# Patient Record
Sex: Female | Born: 1955 | Race: White | Hispanic: No | Marital: Married | Smoking: Never smoker
Health system: Southern US, Community
[De-identification: ages and names within clinical notes are randomized; demographics above are authoritative.]

## PROBLEM LIST (undated history)

## (undated) DIAGNOSIS — R42 Dizziness and giddiness: Secondary | ICD-10-CM

## (undated) DIAGNOSIS — R112 Nausea with vomiting, unspecified: Secondary | ICD-10-CM

## (undated) DIAGNOSIS — Z9889 Other specified postprocedural states: Secondary | ICD-10-CM

## (undated) DIAGNOSIS — K219 Gastro-esophageal reflux disease without esophagitis: Secondary | ICD-10-CM

## (undated) DIAGNOSIS — Z923 Personal history of irradiation: Secondary | ICD-10-CM

## (undated) DIAGNOSIS — M1990 Unspecified osteoarthritis, unspecified site: Secondary | ICD-10-CM

## (undated) DIAGNOSIS — C50919 Malignant neoplasm of unspecified site of unspecified female breast: Secondary | ICD-10-CM

## (undated) DIAGNOSIS — T4145XA Adverse effect of unspecified anesthetic, initial encounter: Secondary | ICD-10-CM

## (undated) DIAGNOSIS — T8859XA Other complications of anesthesia, initial encounter: Secondary | ICD-10-CM

## (undated) HISTORY — DX: Malignant neoplasm of unspecified site of unspecified female breast: C50.919

---

## 2007-01-10 ENCOUNTER — Ambulatory Visit: Payer: Self-pay | Admitting: Family Medicine

## 2007-03-30 HISTORY — PX: COLONOSCOPY: SHX174

## 2007-04-14 ENCOUNTER — Ambulatory Visit: Payer: Self-pay | Admitting: Gastroenterology

## 2011-07-29 DIAGNOSIS — Z8719 Personal history of other diseases of the digestive system: Secondary | ICD-10-CM | POA: Insufficient documentation

## 2011-10-12 ENCOUNTER — Ambulatory Visit: Payer: Self-pay | Admitting: Sports Medicine

## 2012-11-02 ENCOUNTER — Ambulatory Visit: Payer: Self-pay | Admitting: Physical Medicine and Rehabilitation

## 2013-02-07 DIAGNOSIS — M5416 Radiculopathy, lumbar region: Secondary | ICD-10-CM | POA: Insufficient documentation

## 2013-02-07 DIAGNOSIS — G8929 Other chronic pain: Secondary | ICD-10-CM | POA: Insufficient documentation

## 2013-02-07 DIAGNOSIS — M549 Dorsalgia, unspecified: Secondary | ICD-10-CM | POA: Insufficient documentation

## 2013-04-16 ENCOUNTER — Emergency Department: Payer: Self-pay | Admitting: Emergency Medicine

## 2013-04-27 ENCOUNTER — Emergency Department: Payer: Self-pay | Admitting: Emergency Medicine

## 2013-04-27 LAB — COMPREHENSIVE METABOLIC PANEL
ALK PHOS: 77 U/L
ANION GAP: 4 — AB (ref 7–16)
Albumin: 3.6 g/dL (ref 3.4–5.0)
BUN: 14 mg/dL (ref 7–18)
Bilirubin,Total: 0.2 mg/dL (ref 0.2–1.0)
CO2: 31 mmol/L (ref 21–32)
Calcium, Total: 8.5 mg/dL (ref 8.5–10.1)
Chloride: 108 mmol/L — ABNORMAL HIGH (ref 98–107)
Creatinine: 0.99 mg/dL (ref 0.60–1.30)
EGFR (Non-African Amer.): >60
GLUCOSE: 102 mg/dL — AB (ref 65–99)
OSMOLALITY: 286 (ref 275–301)
Potassium: 3.5 mmol/L (ref 3.5–5.1)
SGOT(AST): 17 U/L (ref 15–37)
SGPT (ALT): 17 U/L (ref 12–78)
SODIUM: 143 mmol/L (ref 136–145)
Total Protein: 7 g/dL (ref 6.4–8.2)

## 2013-04-27 LAB — CBC
HCT: 36.3 % (ref 35.0–47.0)
HGB: 12.1 g/dL (ref 12.0–16.0)
MCH: 29.6 pg (ref 26.0–34.0)
MCHC: 33.2 g/dL (ref 32.0–36.0)
MCV: 89 fL (ref 80–100)
PLATELETS: 225 10*3/uL (ref 150–440)
RBC: 4.08 10*6/uL (ref 3.80–5.20)
RDW: 12.9 % (ref 11.5–14.5)
WBC: 6.7 10*3/uL (ref 3.6–11.0)

## 2013-04-27 LAB — LIPASE, BLOOD: Lipase: 102 U/L (ref 73–393)

## 2014-02-07 DIAGNOSIS — M5030 Other cervical disc degeneration, unspecified cervical region: Secondary | ICD-10-CM | POA: Insufficient documentation

## 2014-02-07 DIAGNOSIS — M791 Myalgia, unspecified site: Secondary | ICD-10-CM | POA: Insufficient documentation

## 2014-02-13 DIAGNOSIS — R1013 Epigastric pain: Secondary | ICD-10-CM | POA: Insufficient documentation

## 2014-04-22 ENCOUNTER — Ambulatory Visit: Payer: Self-pay | Admitting: Gastroenterology

## 2014-06-03 ENCOUNTER — Ambulatory Visit: Payer: Self-pay | Admitting: Gastroenterology

## 2014-06-10 ENCOUNTER — Ambulatory Visit: Payer: Self-pay | Admitting: Gastroenterology

## 2014-06-20 ENCOUNTER — Ambulatory Visit: Payer: Self-pay | Admitting: Gastroenterology

## 2014-07-22 LAB — SURGICAL PATHOLOGY

## 2014-08-27 ENCOUNTER — Emergency Department
Admission: EM | Admit: 2014-08-27 | Discharge: 2014-08-27 | Disposition: A | Discharge status: Home / Self Care | Payer: Federal, State, Local not specified - PPO | Attending: Emergency Medicine | Admitting: Emergency Medicine

## 2014-08-27 ENCOUNTER — Encounter: Payer: Self-pay | Admitting: Emergency Medicine

## 2014-08-27 DIAGNOSIS — Y9289 Other specified places as the place of occurrence of the external cause: Secondary | ICD-10-CM | POA: Diagnosis not present

## 2014-08-27 DIAGNOSIS — Y9389 Activity, other specified: Secondary | ICD-10-CM | POA: Diagnosis not present

## 2014-08-27 DIAGNOSIS — Y998 Other external cause status: Secondary | ICD-10-CM | POA: Insufficient documentation

## 2014-08-27 DIAGNOSIS — M5137 Other intervertebral disc degeneration, lumbosacral region: Secondary | ICD-10-CM | POA: Diagnosis not present

## 2014-08-27 DIAGNOSIS — S39012A Strain of muscle, fascia and tendon of lower back, initial encounter: Secondary | ICD-10-CM | POA: Diagnosis not present

## 2014-08-27 DIAGNOSIS — M51379 Other intervertebral disc degeneration, lumbosacral region without mention of lumbar back pain or lower extremity pain: Secondary | ICD-10-CM

## 2014-08-27 DIAGNOSIS — X58XXXA Exposure to other specified factors, initial encounter: Secondary | ICD-10-CM | POA: Insufficient documentation

## 2014-08-27 DIAGNOSIS — M545 Low back pain: Secondary | ICD-10-CM | POA: Diagnosis present

## 2014-08-27 MED ORDER — PREDNISONE 10 MG PO TABS: 10.0000 mg | ORAL_TABLET | Freq: Two times a day (BID) | ORAL | Status: DC

## 2014-08-27 MED ORDER — DIAZEPAM 2 MG PO TABS: 2.0000 mg | ORAL_TABLET | Freq: Three times a day (TID) | ORAL | Status: AC | PRN

## 2014-08-27 MED ORDER — KETOROLAC TROMETHAMINE 60 MG/2ML IM SOLN
INTRAMUSCULAR | Status: AC
  Administered 2014-08-27: 60 mg via INTRAMUSCULAR
  Filled 2014-08-27: qty 2

## 2014-08-27 MED ORDER — DIAZEPAM 5 MG/ML IJ SOLN
5.0000 mg | Freq: Once | INTRAMUSCULAR | Status: AC
  Administered 2014-08-27: 5 mg via INTRAMUSCULAR

## 2014-08-27 MED ORDER — HYDROCODONE-ACETAMINOPHEN 5-325 MG PO TABS: 1.0000 | ORAL_TABLET | Freq: Four times a day (QID) | ORAL | Status: DC | PRN

## 2014-08-27 MED ORDER — DIAZEPAM 5 MG/ML IJ SOLN: 5.0000 mg | Freq: Once | INTRAMUSCULAR | Status: DC

## 2014-08-27 MED ORDER — DIAZEPAM 5 MG/ML IJ SOLN
INTRAMUSCULAR | Status: AC
  Filled 2014-08-27: qty 2

## 2014-08-27 MED ORDER — KETOROLAC TROMETHAMINE 60 MG/2ML IM SOLN
60.0000 mg | Freq: Once | INTRAMUSCULAR | Status: AC
  Administered 2014-08-27: 60 mg via INTRAMUSCULAR

## 2014-08-27 NOTE — Discharge Instructions (Signed)

## 2014-08-27 NOTE — ED Provider Notes (Signed)
[facility] Emergency Department Provider Note ?____________________________________________ ? Time seen: 1520 ? I have reviewed the triage vital signs and the nursing notes. ________ HISTORY ? Chief Complaint Back Pain  HPI  Teresa Silva is a 59 y.o. female ports to the ED with her daughter and son-in-law, with complaints of increasing low back pain over the last few weeks. She denies any injury or trauma, and notes her pain is increased with increasing activity. She describes that yesterday evening her pain worsened substantially, noting bilateral low back pain and referral into the buttocks and down to the knees. The pain as sharp in nature, and pain is increased to spasm when she tries to transition from sitting to standing and, or stand to sit. She denies any incontinence, she denies any lower extremity weakness, she denies any foot drop. She has dosed ibuprofen for her symptoms and denies significant improvement. She recently discontinued use of Lyrica about 6 months ago, noting that her symptoms had improved. She has been followed by Dr. Sharlet Salina, unable to get in with him emergently. She had epidural steroid injections last year with good response. She rates her pain at a 10 out of 10 currently.   History reviewed. No pertinent past medical history.  There are no active problems to display for this patient. ? History reviewed. No pertinent past surgical history. ? Current Outpatient Rx  Name  Route  Sig  Dispense  Refill  . diazepam (VALIUM) 2 MG tablet   Oral   Take 1 tablet (2 mg total) by mouth every 8 (eight) hours as needed for muscle spasms.   10 tablet   0   . HYDROcodone-acetaminophen (NORCO) 5-325 MG per tablet   Oral   Take 1 tablet by mouth every 6 (six) hours as needed for moderate pain.   10 tablet   0   . predniSONE (DELTASONE) 10 MG tablet   Oral   Take 1 tablet (10 mg total) by mouth 2 (two) times daily with a meal.   10 tablet  0   ? Allergies Review of patient's allergies indicates not on file. ? History reviewed. No pertinent family history. ? Social History History  Substance Use Topics  . Smoking status: Never Smoker   . Smokeless tobacco: Not on file  . Alcohol Use: No   Review of Systems  Constitutional: Negative for fever. HEENT: Negative for head trauma, visual changes, sore throat. Cardiovascular: Negative for chest pain. Respiratory: Negative for shortness of breath. Musculoskeletal: Positive for back pain. Skin: Negative for rash. Neurological: Negative for headaches, focal weakness or numbness.  10-point ROS otherwise negative. ____________________________________________  PHYSICAL EXAM:  VITAL SIGNS: ED Triage Vitals  Enc Vitals Group     BP 08/27/14 1347 132/80 mmHg     Pulse Rate 08/27/14 1347 100     Resp 08/27/14 1347 20     Temp 08/27/14 1347 98.3 F (36.8 C)     Temp Source 08/27/14 1347 Oral     SpO2 08/27/14 1347 99 %     Weight 08/27/14 1347 116 lb (52.617 kg)     Height 08/27/14 1347 5\' 3"  (1.6 m)     Head Cir --      Peak Flow --      Pain Score 08/27/14 1347 10     Pain Loc --      Pain Edu? --      Excl. in [place]? --    Constitutional: Alert and oriented. Well appearing  and in no distress. HEENT:Normocephalic and atraumatic.  PERRL. Normal extraocular movements.  No congestion/rhinnorhea. Mucous membranes are moist. Neck: Supple. No cervical lymphadenopathy. Cardiovascular: Normal rate, regular rhythm. No murmurs, rubs, or gallops. Normal and symmetric distal pulses are present in all extremities.  Respiratory: Normal respiratory effort without tachypnea. Breath sounds are clear and equal bilaterally. No wheezes/rales/rhonchi. Gastrointestinal: Soft and nontender. No distention. No abdominal bruits. There is no CVA tenderness. Musculoskeletal: Nontender with normal range of motion in all extremities.  No lower extremity tenderness nor edema. Tenderness to palp  along the lumbar paraspinals and across the sacrum. Negative SLR bilaterally.  Neurologic:  Normal speech and language. CN II-XII grossly intact. No gait instability. Normal DTRs bilaterally. Normal toe dorsiflexion. Skin:  Skin is warm, dry and intact. No rash noted. Psychiatric: Mood and affect are normal. Patient exhibits appropriate insight and judgment. _____________ PROCEDURES ? Procedure(s) performed: Valium 5 mg IM     Toradol 60 mg IM  Critical Care performed:   No ______________________________________________________ INITIAL IMPRESSION / ASSESSMENT AND PLAN / ED COURSE Discussed flare of chronic lumbar DDD with HNP.  Suggest treatment with steroids, muscle relaxant, and pain medicine.  Patient with a stable exam, no indication for imaging at this presentation. She will follow-up with Dr. Sharlet Salina this week.  ____________________________________________ FINAL CLINICAL IMPRESSION(S) / ED DIAGNOSES?  Final diagnoses:  DDD (degenerative disc disease), lumbosacral  Lumbosacral strain, initial encounter      Melvenia Needles, PA-C 08/27/14 1559  Orbie Pyo, MD 08/27/14 [phone]

## 2014-08-27 NOTE — ED Notes (Signed)
Reports mild back pain for 3 years, last night it got worse when she moved.

## 2015-07-30 ENCOUNTER — Other Ambulatory Visit: Payer: Self-pay | Admitting: Preventative Medicine

## 2015-07-30 DIAGNOSIS — Z1231 Encounter for screening mammogram for malignant neoplasm of breast: Secondary | ICD-10-CM

## 2015-08-15 ENCOUNTER — Ambulatory Visit: Payer: Federal, State, Local not specified - PPO

## 2015-08-27 ENCOUNTER — Ambulatory Visit
Admission: RE | Admit: 2015-08-27 | Discharge: 2015-08-27 | Disposition: A | Discharge status: Home / Self Care | Payer: Federal, State, Local not specified - PPO | Source: Ambulatory Visit | Attending: Preventative Medicine | Admitting: Preventative Medicine

## 2015-08-27 DIAGNOSIS — Z1231 Encounter for screening mammogram for malignant neoplasm of breast: Secondary | ICD-10-CM | POA: Insufficient documentation

## 2015-09-03 ENCOUNTER — Other Ambulatory Visit: Payer: Self-pay | Admitting: Preventative Medicine

## 2015-09-03 DIAGNOSIS — R928 Other abnormal and inconclusive findings on diagnostic imaging of breast: Secondary | ICD-10-CM

## 2015-09-10 ENCOUNTER — Other Ambulatory Visit: Payer: Self-pay | Admitting: Preventative Medicine

## 2015-09-10 ENCOUNTER — Ambulatory Visit
Admission: RE | Admit: 2015-09-10 | Discharge: 2015-09-10 | Disposition: A | Discharge status: Home / Self Care | Payer: Federal, State, Local not specified - PPO | Source: Ambulatory Visit | Attending: Preventative Medicine | Admitting: Preventative Medicine

## 2015-09-10 DIAGNOSIS — R928 Other abnormal and inconclusive findings on diagnostic imaging of breast: Secondary | ICD-10-CM | POA: Diagnosis present

## 2015-09-10 DIAGNOSIS — R921 Mammographic calcification found on diagnostic imaging of breast: Secondary | ICD-10-CM | POA: Insufficient documentation

## 2015-09-12 ENCOUNTER — Ambulatory Visit
Admission: RE | Admit: 2015-09-12 | Discharge: 2015-09-12 | Disposition: A | Discharge status: Home / Self Care | Payer: Federal, State, Local not specified - PPO | Source: Ambulatory Visit | Attending: Preventative Medicine | Admitting: Preventative Medicine

## 2015-09-12 DIAGNOSIS — D0511 Intraductal carcinoma in situ of right breast: Secondary | ICD-10-CM | POA: Insufficient documentation

## 2015-09-12 DIAGNOSIS — R921 Mammographic calcification found on diagnostic imaging of breast: Secondary | ICD-10-CM | POA: Diagnosis present

## 2015-09-12 HISTORY — PX: BREAST BIOPSY: SHX20

## 2015-09-16 LAB — SURGICAL PATHOLOGY

## 2015-09-17 ENCOUNTER — Telehealth: Payer: Self-pay

## 2015-09-17 ENCOUNTER — Telehealth: Payer: Self-pay | Admitting: Surgery

## 2015-09-17 NOTE — Telephone Encounter (Signed)
Please call Teresa Silva in the cancer center. Patient is newly diagnosed with breast cancer. They needed an appointment as soon as possible. An appointment was made for Thursday 6/22 at 2pm with Dr Dahlia Byes. Lelon Frohlich is going to ask Dr Grayland Ormond if he thinks an oncology appointment needs to be made, and she would like you to ask the same from Dr Dahlia Byes. Please call and advise.

## 2015-09-17 NOTE — Telephone Encounter (Signed)
Notified patient of Oncology appointment with Dr. Grayland Ormond on Tuesday 09/23/15 at 10:00.

## 2015-09-18 ENCOUNTER — Encounter: Payer: Self-pay | Admitting: Surgery

## 2015-09-18 ENCOUNTER — Ambulatory Visit (INDEPENDENT_AMBULATORY_CARE_PROVIDER_SITE_OTHER): Payer: Federal, State, Local not specified - PPO | Admitting: Surgery

## 2015-09-18 ENCOUNTER — Other Ambulatory Visit: Payer: Self-pay | Admitting: Surgery

## 2015-09-18 VITALS — BP 146/83 | HR 94 | Temp 98.3°F | Ht 63.5 in | Wt 120.0 lb

## 2015-09-18 DIAGNOSIS — D0511 Intraductal carcinoma in situ of right breast: Secondary | ICD-10-CM | POA: Diagnosis not present

## 2015-09-18 NOTE — Patient Instructions (Addendum)
Please go to your appointment with the medical oncologist.  We will send your referral to see the Radiation Oncologist at the [facility].  Please refer to your blue sheet for surgery questions. Surgery will be on 10/13/2015.

## 2015-09-18 NOTE — Telephone Encounter (Signed)
Patient has appointment with 09/23/15 at 10:15am with Dr. Grayland Ormond per Lelon Frohlich. Patient has been notified of this appointment. Breast Cancer Education bag was left in [facility] and will need to be given to patient at appointment.

## 2015-09-18 NOTE — Progress Notes (Signed)
Surgical Consultation  09/18/2015  Teresa Silva is an 60 y.o. female.   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 U/S core bx  ER and PR negative. She denies any pain, any nipple discharge or any masses. She is healing very well from the biopsy site. She has good cardiovascular reserve and is able to perform more than 4 Mets of activity without any shortness of breath or chest pain. He has been 4 years since her last previous normal mammogram GYN History: Menarche  60 years old, 11 years ago. No evidence of exogenous estrogen use  Family Breast Cancer History: cousin with breast cancer no other breast cancer history and no history of ovarian cancer  History reviewed. No pertinent past medical history.  Past Surgical History  Procedure Laterality Date  . Breast biopsy Right 09/12/2015    stereo  . Colonoscopy  2009    no polyps    Family History  Problem Relation Age of Onset  . Hypertension Mother   . Lung cancer Mother   . Arthritis Mother   . Hypertension Father   . COPD Father   . Liver cancer Sister   . Breast cancer Cousin     Social History:  reports that she has never smoked. She does not have any smokeless tobacco history on file. She reports that she does not drink alcohol. Her drug history is not on file.  Allergies:  Allergies  Allergen Reactions  . Tramadol Hcl Other (See Comments)    Flu Like Symtom    Medications reviewed.   Review of Systems:   ROS 10 pt ROS negative  Physical Exam:  BP 146/83 mmHg  Pulse 94  Temp(Src) 98.3 F (36.8 C) (Oral)  Ht 5' 3.5" (1.613 m)  Wt 54.432 kg (120 lb)  BMI 20.92 kg/m2  Physical Exam NAD awake alert NEck: no LAD, NO JVD, trach midline Breast Exam: RIGHT 12 o'clock bx site 5 cms from nipple, no infection, NO masses, nipples and skin is normal. Normal left breast LYmph nodes: no  axillary or neck LAD RESP: lungs CTA CV: s1,s2, no murmurs ABD: soft, NT, no masses, no hernia EXT: well perfused , warm , no edema  NEURO: awake alert, no motor or sensory deficits. CN intact.   Assessment/Plan: 60 year old with newly diagnosed  DCIS on routine mammogram. Discussed in detail with her about breast cancer and multimodal therapy. Options about mastectomy versus lumpectomy plus radiation therapy were discussed at length. I also discussed with her about the role for sentinel lymph node biopsy although technically is a DCIS with a small fossae have also explained to her that there is a chance of the pathology being upgraded and then having to perform a sentinel lymph node biopsy. With that being said the patient prefers to go ahead and have her axillae stage with sentinel lymph node bx. We'll also make arrangements to see medical oncology and radiation oncology. If she chooses to have a lumpectomy she will require radiation therapy. Also discussed with her that chemotherapy would be based on her final pathology and on staging of her axilla. I have also discussed with her about the procedure, risk, benefits and possible complications including but not limited to: Bleeding, infection, reexcision, nerve injury, lymphedema. She understands and wishes to proceed. We will tentatively schedule her for July 17 for a right needle localization lumpectomy with sentinel lymph node biopsy.  All questions were answered and extensive counseling was provided  Jules Husbands MD FACS

## 2015-09-19 ENCOUNTER — Telehealth: Payer: Self-pay | Admitting: Surgery

## 2015-09-19 NOTE — Telephone Encounter (Signed)
Pt advised of pre op date/time and sx date. Sx: 10/13/15 with Dr Othelia Pulling lumpectomy with Sentinel Node and Needle Localization.  Pre op: 10/03/15 between 9-1:00 pm--Phone.   Patient made aware to arrive at [facility] the day of surgery at 8:00 am.

## 2015-09-22 NOTE — Progress Notes (Signed)
  Oncology Nurse Navigator Documentation  Navigator Location: [facility]-Med Onc (09/22/15 0800) Navigator Encounter Type: Introductory phone call;Telephone (09/22/15 0800) Telephone: [name] Call;Appt Confirmation/Clarification;Education (09/22/15 0800) Abnormal Finding Date: 09/10/15 (09/22/15 0800) Confirmed Diagnosis Date: 09/12/15 (09/22/15 0800) Surgery Date: 10/13/15 (09/22/15 0800) Treatment Initiated Date: 10/13/15 (09/22/15 0800) Patient Visit Type: Initial (09/22/15 0800) Treatment Phase: Pre-Tx/Tx Discussion (09/22/15 0800) Barriers/Navigation Needs: Education;Coordination of Care (09/22/15 0800) Education: Accessing Care/ Finding Providers;Coping with Diagnosis/ Prognosis;Newly Diagnosed Cancer Education (09/22/15 0800) Interventions: Coordination of Care;Education Method (09/22/15 0800)            Acuity: Level 2 (09/22/15 0800)   Acuity Level 2: Initial guidance, education and coordination as needed;Educational needs;Assistance expediting appointments (09/22/15 0800)     Time Spent with Patient: 60 (09/22/15 0800)   Introduced IT trainer. Scheduled appointments for Surgery Consult with Dr. Dahlia Byes, and Breast Clinic with Dr. Grayland Ormond.  Took Breast Cancer Treatment Handbook to surgeon's office.  Will meet patient in Breast Clinic on 09/23/15.   Collected history/risk factors for case conference.

## 2015-09-23 ENCOUNTER — Inpatient Hospital Stay: Payer: Federal, State, Local not specified - PPO | Attending: Oncology | Admitting: Oncology

## 2015-09-23 ENCOUNTER — Encounter: Payer: Self-pay | Admitting: Oncology

## 2015-09-23 VITALS — BP 145/82 | HR 88 | Temp 98.6°F | Resp 18 | Wt 120.8 lb

## 2015-09-23 DIAGNOSIS — D0511 Intraductal carcinoma in situ of right breast: Secondary | ICD-10-CM | POA: Insufficient documentation

## 2015-09-23 DIAGNOSIS — Z171 Estrogen receptor negative status [ER-]: Secondary | ICD-10-CM | POA: Diagnosis not present

## 2015-09-23 NOTE — Progress Notes (Signed)
[place]  [phone] [phone]  ID: Teresa Silva OB: 1955/08/15  MR#: [id number]  [id number]  Patient Care Team: Pcp Not In System as PCP - General  CHIEF COMPLAINT: ER/PR negative right breast DCIS. Chief Complaint  Patient presents with  . Breast Cancer    INTERVAL HISTORY: Patient is a 60 year old female who was noted to have an abnormality on routine screening mammogram. Subsequent biopsy revealed DCIS that was ER/PR negative. She currently is anxious, but otherwise feels well. She has no neurologic complaints. She denies any recent fevers. She has a good appetite and denies weight loss. She denies any pain. She has no chest pain or shortness of breath. She denies any nausea, vomiting, constipation, or diarrhea. She has no urinary complaints. Patient feels at her baseline and offers no further specific complaints today.  REVIEW OF SYSTEMS:   Review of Systems  Constitutional: Negative.  Negative for fever, weight loss and malaise/fatigue.  Respiratory: Negative.  Negative for sputum production.   Cardiovascular: Negative.  Negative for chest pain.  Gastrointestinal: Negative.  Negative for abdominal pain.  Genitourinary: Negative.   Musculoskeletal: Negative.   Neurological: Negative.  Negative for weakness.  Psychiatric/Behavioral: The patient is nervous/anxious.     As per HPI. Otherwise, a complete review of systems is negatve.  PAST MEDICAL HISTORY: Past Medical History  Diagnosis Date  . Breast cancer ([place])   . Last menstrual period (LMP) > 10 days ago     LMP 2006    PAST SURGICAL HISTORY: Past Surgical History  Procedure Laterality Date  . Breast biopsy Right 09/12/2015    stereo  . Colonoscopy  2009    no polyps    FAMILY HISTORY Family History  Problem Relation Age of Onset  . Hypertension Mother   . Lung cancer Mother   . Arthritis Mother   . Hypertension Father   . COPD Father   . Liver cancer  Sister   . Breast cancer Cousin        ADVANCED DIRECTIVES:    HEALTH MAINTENANCE: Social History  Substance Use Topics  . Smoking status: Never Smoker   . Smokeless tobacco: Not on file  . Alcohol Use: No     Colonoscopy:  PAP:  Bone density:  Lipid panel:  Allergies  Allergen Reactions  . Tramadol Hcl Other (See Comments)    Flu Like Symtom    Current Outpatient Prescriptions  Medication Sig Dispense Refill  . acetaminophen (TYLENOL) 325 MG tablet Take 650 mg by mouth every 6 (six) hours as needed for mild pain.      No current facility-administered medications for this visit.    OBJECTIVE: Filed Vitals:   09/23/15 1028  BP: 145/82  Pulse: 88  Temp: 98.6 F (37 C)  Resp: 18     Body mass index is 21.06 kg/(m^2).    ECOG FS:0 - Asymptomatic  General: Well-developed, well-nourished, no acute distress. Eyes: Pink conjunctiva, anicteric sclera. HEENT: Normocephalic, moist mucous membranes, clear oropharnyx. Breasts: Patient requested exam be deferred today. Lungs: Clear to auscultation bilaterally. Heart: Regular rate and rhythm. No rubs, murmurs, or gallops. Abdomen: Soft, nontender, nondistended. No organomegaly noted, normoactive bowel sounds. Musculoskeletal: No edema, cyanosis, or clubbing. Neuro: Alert, answering all questions appropriately. Cranial nerves grossly intact. Skin: No rashes or petechiae noted. Psych: Normal affect. Lymphatics: No cervical, calvicular, axillary or inguinal LAD.   LAB RESULTS:  Lab Results  Component Value Date   NA 143 04/27/2013  K 3.5 04/27/2013   CL 108* 04/27/2013   CO2 31 04/27/2013   GLUCOSE 102* 04/27/2013   BUN 14 04/27/2013   CREATININE 0.99 04/27/2013   CALCIUM 8.5 04/27/2013   PROT 7.0 04/27/2013   ALBUMIN 3.6 04/27/2013   AST 17 04/27/2013   ALT 17 04/27/2013   ALKPHOS 77 04/27/2013   BILITOT 0.2 04/27/2013   GFRNONAA >60 04/27/2013   GFRAA >60 04/27/2013    Lab Results  Component Value  Date   WBC 6.7 04/27/2013   HGB 12.1 04/27/2013   HCT 36.3 04/27/2013   MCV 89 04/27/2013   PLT 225 04/27/2013     STUDIES: Mm Digital Screening Bilateral  09/01/2015  CLINICAL DATA:  Screening. EXAM: DIGITAL SCREENING BILATERAL MAMMOGRAM WITH CAD COMPARISON:  Previous exam(s). ACR Breast Density Category b: There are scattered areas of fibroglandular density. FINDINGS: In the right breast, microcalcifications/possible mass warrant further evaluation with magnified views. In the left breast, no findings suspicious for malignancy. Images were processed with CAD. IMPRESSION: Further evaluation is suggested for microcalcifications/possible mass in the right breast. RECOMMENDATION: Diagnostic mammogram of the right breast. (Code:[id number]) The patient will be contacted regarding the findings, and additional imaging will be scheduled. BI-RADS CATEGORY  0: Incomplete. Need additional imaging evaluation and/or prior mammograms for comparison. Electronically Signed   By: Marin Olp M.D.   On: 09/01/2015 08:30   Mm Diag Breast Tomo Uni Right  09/10/2015  CLINICAL DATA:  Patient returns today to evaluate calcifications and a possible mass within the upper right breast. EXAM: 2D DIGITAL DIAGNOSTIC UNILATERAL RIGHT MAMMOGRAM WITH CAD AND ADJUNCT TOMO COMPARISON:  Previous exam(s). ACR Breast Density Category b: There are scattered areas of fibroglandular density. FINDINGS: On today's additional views of the right breast with magnification, grouped punctate and slightly pleomorphic calcifications are confirmed within the upper right breast, at middle depth, 12 o'clock axis region, spanning approximately 5 mm. No associated mass is seen with the addition of 3D tomosynthesis. Mammographic images were processed with CAD. IMPRESSION: Grouped punctate and slightly pleomorphic calcifications within the upper right breast, at middle depth, 12 o'clock axis region, for which stereotactic-guided biopsy is recommended.  RECOMMENDATION: Stereotactic-guided biopsy of the right breast calcifications. Findings will be relayed to the ordering physician and patient will be scheduled for stereotactic-guided biopsy at her earliest convenience. I have discussed the findings and recommendations with the patient. Results were also provided in writing at the conclusion of the visit. If applicable, a reminder letter will be sent to the patient regarding the next appointment. BI-RADS CATEGORY  4: Suspicious. Electronically Signed   By: Franki Cabot M.D.   On: 09/10/2015 09:56   Mm Clip Placement Right  09/16/2015  ADDENDUM REPORT: 09/16/2015 15:08 ADDENDUM: The pathology associated with the right breast stereotactic biopsy demonstrated atypical ductal hyperplasia and low-grade DCIS with calcifications. Pathology is concordant with the imaging findings. I have discussed findings with the patient and answered her questions. Patient states that her biopsy site is clean and dry without hematoma formation. There is mild tenderness present. The patient's referring physician's office will contact the patient for surgical referral. Post biopsy wound care instructions were reviewed with the patient and the patient was encouraged to call our breast center for additional questions or concerns. Electronically Signed   By: Altamese Cabal M.D.   On: 09/16/2015 15:08  09/16/2015  CLINICAL DATA:  Status post stereotactic biopsy of right breast calcifications. EXAM: DIAGNOSTIC RIGHT MAMMOGRAM POST STEREOTACTIC BIOPSY COMPARISON:  Previous exam(s). FINDINGS:  Mammographic images were obtained following stereotactic guided biopsy of calcifications in the 12 o'clock region the right breast. Mammographic images showed there is a top hat shaped clip in the 12 o'clock region of the right breast. IMPRESSION: Status post stereotactic biopsy of the right breast with pathology pending. Final Assessment: Post Procedure Mammograms for Marker Placement Electronically  Signed: By: Lillia Mountain M.D. On: 09/12/2015 12:08   Mm Rt Breast Bx W Loc Dev 1st Lesion Image Bx Spec Stereo Guide  09/12/2015  CLINICAL DATA:  Right breast calcifications. EXAM: RIGHT BREAST STEREOTACTIC CORE NEEDLE BIOPSY COMPARISON:  Previous exams. FINDINGS: The patient and I discussed the procedure of stereotactic-guided biopsy including benefits and alternatives. We discussed the high likelihood of a successful procedure. We discussed the risks of the procedure including infection, bleeding, tissue injury, clip migration, and inadequate sampling. Informed written consent was given. The usual time out protocol was performed immediately prior to the procedure. Using sterile technique and 1% lidocaine 1% lidocaine with epinephrine as local anesthetic, under stereotactic guidance, a 9 gauge vacuum assisted device was used to perform core needle biopsy of calcifications in the 12 o'clock region the right breast using a superior to inferior approach. Specimen radiograph was performed showing calcifications are present in the tissue samples. Specimens with calcifications are identified for pathology. At the conclusion of the procedure, a top hat shaped tissue marker clip was deployed into the biopsy cavity. Follow-up 2-view mammogram was performed and dictated separately. IMPRESSION: Stereotactic-guided biopsy of the right breast. No apparent complications. Electronically Signed   By: Lillia Mountain M.D.   On: 09/12/2015 11:54    ASSESSMENT: Right breast DCIS, ER/PR negative.  PLAN:    1. Right breast DCIS: Pathology from biopsy did not have an invasive component and was also ER/PR negative which is unusual for DCIS. Agree with pursuing lumpectomy and patient has surgery scheduled for October 13, 2015.  Although lesion is ER/PR negative, because there is not invasive component she likely will not require adjuvant chemotherapy. If final pathology results with an invasive component will further discuss adjuvant  treatment at her next clinic visit. Patient will definitely benefit from adjuvant XRT and will have consultation with radiation oncology at the end of July. Tamoxifen will not benefit the patient given the ER/PR status of her DCIS. No further intervention is needed at this time. Return to clinic at the end of July after her surgery to discuss her final pathology results and additional treatment planning.  Approximately 45 minutes was spent in discussion of which greater than 50% was consultation.  Patient expressed understanding and was in agreement with this plan. She also understands that She can call clinic at any time with any questions, concerns, or complaints.     Lloyd Huger, MD   09/23/2015 12:40 PM

## 2015-09-23 NOTE — Progress Notes (Signed)
  Oncology Nurse Navigator Documentation  Navigator Location: [facility]Med Onc (09/23/15 1500) Navigator Encounter Type: Initial MedOnc (09/23/15 1500)           Patient Visit Type: MedOnc;Initial (09/23/15 1500) Treatment Phase: Pre-Tx/Tx Discussion (09/23/15 1500) Barriers/Navigation Needs: Education;Coordination of Care (09/23/15 1500)                Acuity: Level 2 (09/23/15 1500)   Acuity Level 2: Initial guidance, education and coordination as needed;Assistance expediting appointments (09/23/15 1500)     Time Spent with Patient: 60 (09/23/15 1500)   Met with patient, and husband at initial Med Onc visit.  The joy of their lives is their two year old grandson.  Both are retired.  Husband has back issues and is being followed at [facility].  Dr. Grayland Ormond reviewed treatment plan of surgery followed by radiation, dependent on final pathology. She is scheduledto return on 10/21/15 for consult with Dr. Baruch Gouty, and to follow-up with Dr. Grayland Ormond.

## 2015-09-23 NOTE — Progress Notes (Signed)
States is feeling well. Offers no complaints. Surgery scheduled for 7/17 with Dr. Dahlia Byes.

## 2015-09-27 HISTORY — PX: BREAST LUMPECTOMY: SHX2

## 2015-10-01 ENCOUNTER — Other Ambulatory Visit: Payer: Self-pay | Admitting: *Deleted

## 2015-10-01 ENCOUNTER — Telehealth: Payer: Self-pay | Admitting: *Deleted

## 2015-10-01 MED ORDER — HYDROCODONE-ACETAMINOPHEN 5-325 MG PO TABS: 1.0000 | ORAL_TABLET | Freq: Four times a day (QID) | ORAL | Status: DC | PRN

## 2015-10-01 NOTE — Telephone Encounter (Signed)
Patient called in to report pain at site of breast biopsy. Patient advised by triage RN that pain at biopsy site is expected.

## 2015-10-01 NOTE — Telephone Encounter (Signed)
Follow up call placed to patient regarding breast pain at biopsy site. Patient denies redness, warmth or drainage from site. Patient reassured that pain is normal, per Dr. Grayland Ormond. Patient offered prescription for Norco for short term pain management. Patient to pick up prescription today. Patient verbalized understanding of plan.

## 2015-10-03 ENCOUNTER — Other Ambulatory Visit: Payer: Federal, State, Local not specified - PPO

## 2015-10-03 ENCOUNTER — Encounter: Payer: Self-pay | Admitting: *Deleted

## 2015-10-03 NOTE — Patient Instructions (Signed)
  Your procedure is scheduled on: 10-13-15 Report to [place] @ 8 AM PER PT    Remember: Instructions that are not followed completely may result in serious medical risk, up to and including death, or upon the discretion of your surgeon and anesthesiologist your surgery may need to be rescheduled.    _x___ 1. Do not eat food or drink liquids after midnight. No gum chewing or hard candies.     __x__ 2. No Alcohol for 24 hours before or after surgery.   __x__3. No Smoking for 24 prior to surgery.   ____  4. Bring all medications with you on the day of surgery if instructed.    __x__ 5. Notify your doctor if there is any change in your medical condition     (cold, fever, infections).     Do not wear jewelry, make-up, hairpins, clips or nail polish.  Do not wear lotions, powders, or perfumes. You may wear deodorant.  Do not shave 48 hours prior to surgery. Men may shave face and neck.  Do not bring valuables to the hospital.    [facility] is not responsible for any belongings or valuables.               Contacts, dentures or bridgework may not be worn into surgery.  Leave your suitcase in the car. After surgery it may be brought to your room.  For patients admitted to the hospital, discharge time is determined by your treatment team.   Patients discharged the day of surgery will not be allowed to drive home.    Please read over the following fact sheets that you were given:   [facility] Preparing for Surgery and or MRSA Information   _x___ Take these medicines the morning of surgery with A SIP OF WATER:    1. ZANTAC  2. TAKE A ZANTAC ON Sunday NIGHT BEFORE BED  3.  4.  5.  6.  ____ Fleet Enema (as directed)   ____ Use CHG Soap or sage wipes as directed on instruction sheet   ____ Use inhalers on the day of surgery and bring to hospital day of surgery  ____ Stop metformin 2 days prior to surgery    ____ Take 1/2 of usual insulin dose the night before surgery  and none on the morning of surgery.   ____ Stop aspirin or coumadin, or plavix  _x__ Stop Anti-inflammatories such as Advil, Aleve, Ibuprofen, Motrin, Naproxen,          Naprosyn, Goodies powders or aspirin products. Ok to take Tylenol.   ____ Stop supplements until after surgery.    ____ Bring C-Pap to the hospital.

## 2015-10-13 ENCOUNTER — Ambulatory Visit
Admission: RE | Admit: 2015-10-13 | Discharge: 2015-10-13 | Disposition: A | Discharge status: Home / Self Care | Payer: Federal, State, Local not specified - PPO | Source: Ambulatory Visit | Attending: Surgery | Admitting: Surgery

## 2015-10-13 ENCOUNTER — Encounter
Admission: RE | Disposition: A | Discharge status: Home / Self Care | Payer: Self-pay | Source: Ambulatory Visit | Attending: Surgery

## 2015-10-13 ENCOUNTER — Ambulatory Visit: Payer: Federal, State, Local not specified - PPO | Admitting: Anesthesiology

## 2015-10-13 ENCOUNTER — Encounter: Payer: Self-pay | Admitting: *Deleted

## 2015-10-13 ENCOUNTER — Encounter
Admission: RE | Admit: 2015-10-13 | Discharge: 2015-10-13 | Disposition: A | Discharge status: Home / Self Care | Payer: Federal, State, Local not specified - PPO | Source: Ambulatory Visit | Attending: Surgery | Admitting: Surgery

## 2015-10-13 DIAGNOSIS — Z800 Family history of malignant neoplasm of digestive organs: Secondary | ICD-10-CM | POA: Diagnosis not present

## 2015-10-13 DIAGNOSIS — Z825 Family history of asthma and other chronic lower respiratory diseases: Secondary | ICD-10-CM | POA: Diagnosis not present

## 2015-10-13 DIAGNOSIS — Z803 Family history of malignant neoplasm of breast: Secondary | ICD-10-CM | POA: Diagnosis not present

## 2015-10-13 DIAGNOSIS — Z888 Allergy status to other drugs, medicaments and biological substances status: Secondary | ICD-10-CM | POA: Insufficient documentation

## 2015-10-13 DIAGNOSIS — K219 Gastro-esophageal reflux disease without esophagitis: Secondary | ICD-10-CM | POA: Diagnosis not present

## 2015-10-13 DIAGNOSIS — Z171 Estrogen receptor negative status [ER-]: Secondary | ICD-10-CM | POA: Insufficient documentation

## 2015-10-13 DIAGNOSIS — D0511 Intraductal carcinoma in situ of right breast: Secondary | ICD-10-CM

## 2015-10-13 DIAGNOSIS — Z801 Family history of malignant neoplasm of trachea, bronchus and lung: Secondary | ICD-10-CM | POA: Insufficient documentation

## 2015-10-13 DIAGNOSIS — C50911 Malignant neoplasm of unspecified site of right female breast: Secondary | ICD-10-CM | POA: Diagnosis present

## 2015-10-13 DIAGNOSIS — Z8249 Family history of ischemic heart disease and other diseases of the circulatory system: Secondary | ICD-10-CM | POA: Insufficient documentation

## 2015-10-13 DIAGNOSIS — Z8261 Family history of arthritis: Secondary | ICD-10-CM | POA: Diagnosis not present

## 2015-10-13 HISTORY — PX: BREAST LUMPECTOMY WITH NEEDLE LOCALIZATION AND AXILLARY LYMPH NODE DISSECTION: SHX5758

## 2015-10-13 HISTORY — DX: Gastro-esophageal reflux disease without esophagitis: K21.9

## 2015-10-13 HISTORY — DX: Unspecified osteoarthritis, unspecified site: M19.90

## 2015-10-13 HISTORY — PX: BREAST EXCISIONAL BIOPSY: SUR124

## 2015-10-13 HISTORY — PX: SENTINEL NODE BIOPSY: SHX6608

## 2015-10-13 SURGERY — BREAST LUMPECTOMY WITH NEEDLE LOCALIZATION AND AXILLARY LYMPH NODE DISSECTION
Anesthesia: General | Laterality: Right

## 2015-10-13 MED ORDER — FENTANYL CITRATE (PF) 100 MCG/2ML IJ SOLN
25.0000 ug | INTRAMUSCULAR | Status: DC | PRN
  Administered 2015-10-13 (×3): 25 ug via INTRAVENOUS

## 2015-10-13 MED ORDER — TECHNETIUM TC 99M SULFUR COLLOID
0.9990 | Freq: Once | INTRAVENOUS | Status: AC | PRN
  Administered 2015-10-13: 0.999 via INTRAVENOUS

## 2015-10-13 MED ORDER — FENTANYL CITRATE (PF) 100 MCG/2ML IJ SOLN
INTRAMUSCULAR | Status: DC
Start: 2015-10-13 — End: 2015-10-13
  Filled 2015-10-13: qty 2

## 2015-10-13 MED ORDER — LACTATED RINGERS IV SOLN
INTRAVENOUS | Status: DC
  Administered 2015-10-13: 10:00:00 via INTRAVENOUS

## 2015-10-13 MED ORDER — KETOROLAC TROMETHAMINE 30 MG/ML IJ SOLN
INTRAMUSCULAR | Status: DC | PRN
  Administered 2015-10-13: 30 mg via INTRAVENOUS

## 2015-10-13 MED ORDER — ACETAMINOPHEN 10 MG/ML IV SOLN
INTRAVENOUS | Status: AC
  Filled 2015-10-13: qty 100

## 2015-10-13 MED ORDER — OXYCODONE HCL 5 MG PO TABS: 5.0000 mg | ORAL_TABLET | Freq: Once | ORAL | Status: DC | PRN

## 2015-10-13 MED ORDER — CEFAZOLIN SODIUM-DEXTROSE 2-4 GM/100ML-% IV SOLN
2.0000 g | INTRAVENOUS | Status: AC
  Administered 2015-10-13: 2 g via INTRAVENOUS

## 2015-10-13 MED ORDER — DEXAMETHASONE SODIUM PHOSPHATE 10 MG/ML IJ SOLN
INTRAMUSCULAR | Status: DC | PRN
  Administered 2015-10-13: 4 mg via INTRAVENOUS

## 2015-10-13 MED ORDER — LIDOCAINE HCL (CARDIAC) 20 MG/ML IV SOLN
INTRAVENOUS | Status: DC | PRN
  Administered 2015-10-13: 80 mg via INTRAVENOUS

## 2015-10-13 MED ORDER — METHYLENE BLUE 0.5 % INJ SOLN: INTRAVENOUS | Status: DC | PRN

## 2015-10-13 MED ORDER — CHLORHEXIDINE GLUCONATE CLOTH 2 % EX PADS: 6.0000 | MEDICATED_PAD | Freq: Once | CUTANEOUS | Status: DC

## 2015-10-13 MED ORDER — MEPERIDINE HCL 25 MG/ML IJ SOLN: 6.2500 mg | INTRAMUSCULAR | Status: DC | PRN

## 2015-10-13 MED ORDER — ISOSULFAN BLUE 1 % ~~LOC~~ SOLN
SUBCUTANEOUS | Status: AC
  Filled 2015-10-13: qty 5

## 2015-10-13 MED ORDER — PHENYLEPHRINE HCL 10 MG/ML IJ SOLN
INTRAMUSCULAR | Status: DC | PRN
  Administered 2015-10-13 (×4): 50 ug via INTRAVENOUS

## 2015-10-13 MED ORDER — ISOSULFAN BLUE 1 % ~~LOC~~ SOLN
SUBCUTANEOUS | Status: DC | PRN
  Administered 2015-10-13: 5 mL via SUBCUTANEOUS

## 2015-10-13 MED ORDER — PROMETHAZINE HCL 25 MG/ML IJ SOLN: 6.2500 mg | INTRAMUSCULAR | Status: DC | PRN

## 2015-10-13 MED ORDER — LIDOCAINE-EPINEPHRINE 1 %-1:100000 IJ SOLN
INTRAMUSCULAR | Status: AC
  Filled 2015-10-13: qty 1

## 2015-10-13 MED ORDER — FENTANYL CITRATE (PF) 100 MCG/2ML IJ SOLN
INTRAMUSCULAR | Status: DC | PRN
  Administered 2015-10-13 (×3): 25 ug via INTRAVENOUS
  Administered 2015-10-13: 50 ug via INTRAVENOUS

## 2015-10-13 MED ORDER — CEFAZOLIN SODIUM-DEXTROSE 2-4 GM/100ML-% IV SOLN
INTRAVENOUS | Status: AC
  Filled 2015-10-13: qty 100

## 2015-10-13 MED ORDER — PROPOFOL 10 MG/ML IV BOLUS
INTRAVENOUS | Status: DC | PRN
  Administered 2015-10-13: 110 mg via INTRAVENOUS

## 2015-10-13 MED ORDER — OXYCODONE-ACETAMINOPHEN 5-325 MG PO TABS: 1.0000 | ORAL_TABLET | ORAL | Status: DC | PRN

## 2015-10-13 MED ORDER — BUPIVACAINE-EPINEPHRINE (PF) 0.25% -1:200000 IJ SOLN
INTRAMUSCULAR | Status: AC
  Filled 2015-10-13: qty 30

## 2015-10-13 MED ORDER — MIDAZOLAM HCL 2 MG/2ML IJ SOLN
INTRAMUSCULAR | Status: DC | PRN
  Administered 2015-10-13: 2 mg via INTRAVENOUS

## 2015-10-13 MED ORDER — ONDANSETRON HCL 4 MG/2ML IJ SOLN
INTRAMUSCULAR | Status: DC | PRN
  Administered 2015-10-13: 4 mg via INTRAVENOUS

## 2015-10-13 MED ORDER — OXYCODONE HCL 5 MG/5ML PO SOLN: 5.0000 mg | Freq: Once | ORAL | Status: DC | PRN

## 2015-10-13 MED ORDER — BUPIVACAINE-EPINEPHRINE 0.25% -1:200000 IJ SOLN
INTRAMUSCULAR | Status: DC | PRN
  Administered 2015-10-13: 30 mL

## 2015-10-13 MED ORDER — ACETAMINOPHEN 10 MG/ML IV SOLN
INTRAVENOUS | Status: DC | PRN
  Administered 2015-10-13: 1000 mg via INTRAVENOUS

## 2015-10-13 SURGICAL SUPPLY — 24 items
APPLIER CLIP 9.375 SM OPEN (CLIP) ×3
CANISTER SUCT 1200ML W/VALVE (MISCELLANEOUS) ×3 IMPLANT
CHLORAPREP W/TINT 26ML (MISCELLANEOUS) ×3 IMPLANT
CLIP APPLIE 9.375 SM OPEN (CLIP) ×1 IMPLANT
DRAPE LAPAROTOMY 100X77 ABD (DRAPES) ×3 IMPLANT
ELECT REM PT RETURN 9FT ADLT (ELECTROSURGICAL) ×3
ELECTRODE REM PT RTRN 9FT ADLT (ELECTROSURGICAL) ×1 IMPLANT
GLOVE BIO SURGEON STRL SZ7 (GLOVE) ×3 IMPLANT
GLOVE INDICATOR 7.5 STRL GRN (GLOVE) ×3 IMPLANT
GOWN STRL REUS W/ TWL LRG LVL3 (GOWN DISPOSABLE) ×2 IMPLANT
GOWN STRL REUS W/TWL LRG LVL3 (GOWN DISPOSABLE) ×4
KIT RM TURNOVER STRD PROC AR (KITS) ×3 IMPLANT
LIQUID BAND (GAUZE/BANDAGES/DRESSINGS) ×3 IMPLANT
NEEDLE HYPO 22GX1.5 SAFETY (NEEDLE) ×3 IMPLANT
PACK BASIN MINOR ARMC (MISCELLANEOUS) ×3 IMPLANT
SPONGE LAP 18X18 5 PK (GAUZE/BANDAGES/DRESSINGS) ×3 IMPLANT
SUT MNCRL AB 4-0 PS2 18 (SUTURE) ×3 IMPLANT
SUT SILK 0 SH 30 (SUTURE) ×3 IMPLANT
SUT VIC AB 0 CT2 27 (SUTURE) ×3 IMPLANT
SUT VIC AB 1 CTX 27 (SUTURE) ×3 IMPLANT
SUT VIC AB 3-0 SH 27 (SUTURE) ×4
SUT VIC AB 3-0 SH 27X BRD (SUTURE) ×2 IMPLANT
SYRINGE 10CC LL (SYRINGE) ×3 IMPLANT
WATER STERILE IRR 1000ML POUR (IV SOLUTION) ×3 IMPLANT

## 2015-10-13 NOTE — Anesthesia Preprocedure Evaluation (Signed)
Anesthesia Evaluation  Patient identified by MRN, date of birth, ID band Patient awake    Reviewed: Allergy & Precautions, NPO status , Patient's Chart, lab work & pertinent test results  History of Anesthesia Complications Negative for: history of anesthetic complications  Airway Mallampati: I  TM Distance: >3 FB Neck ROM: Full    Dental no notable dental hx.    Pulmonary neg COPD,    Pulmonary exam normal - rhonchi (-) wheezing      Cardiovascular Exercise Tolerance: Good (-) hypertension(-) CAD and (-) Past MI  Rhythm:Regular Rate:Normal - Systolic murmurs and - Diastolic murmurs    Neuro/Psych negative neurological ROS     GI/Hepatic Neg liver ROS, GERD  Controlled,  Endo/Other  neg diabetes  Renal/GU negative Renal ROS     Musculoskeletal  (+) Arthritis , Osteoarthritis,    Abdominal Normal abdominal exam  (+)   Peds  Hematology negative hematology ROS (+)   Anesthesia Other Findings Breast cancer  Reproductive/Obstetrics                             Anesthesia Physical Anesthesia Plan  ASA: III  Anesthesia Plan: General   Post-op Pain Management:    Induction: Intravenous  Airway Management Planned: LMA  Additional Equipment:   Intra-op Plan:   Post-operative Plan: Extubation in OR  Informed Consent: I have reviewed the patients History and Physical, chart, labs and discussed the procedure including the risks, benefits and alternatives for the proposed anesthesia with the patient or authorized representative who has indicated his/her understanding and acceptance.   Dental advisory given  Plan Discussed with:   Anesthesia Plan Comments:         Anesthesia Quick Evaluation

## 2015-10-13 NOTE — Anesthesia Procedure Notes (Signed)
Procedure Name: LMA Insertion Performed by: Danen Lapaglia Pre-anesthesia Checklist: Patient identified, Patient being monitored, Timeout performed, Emergency Drugs available and Suction available Patient Re-evaluated:Patient Re-evaluated prior to inductionOxygen Delivery Method: Circle system utilized Preoxygenation: Pre-oxygenation with 100% oxygen Intubation Type: IV induction Ventilation: Mask ventilation without difficulty LMA: LMA inserted LMA Size: 3.0 Tube type: Oral Number of attempts: 1 Placement Confirmation: positive ETCO2 and breath sounds checked- equal and bilateral Tube secured with: Tape Dental Injury: Teeth and Oropharynx as per pre-operative assessment        

## 2015-10-13 NOTE — Transfer of Care (Signed)
Immediate Anesthesia Transfer of Care Note  Patient: Teresa Silva  Procedure(s) Performed: Procedure(s): BREAST LUMPECTOMY WITH NEEDLE LOCALIZATION AND AXILLARY LYMPH NODE DISSECTION (Right) SENTINEL NODE BIOPSY (Right)  Patient Location: PACU  Anesthesia Type:General  Level of Consciousness: awake, alert  and responds to stimulation  Airway & Oxygen Therapy: Patient Spontanous Breathing and Patient connected to face mask oxygen  Post-op Assessment: Report given to RN and Post -op Vital signs reviewed and stable  Post vital signs: Reviewed and stable  Last Vitals:  Filed Vitals:   10/13/15 1204 10/13/15 1206  BP: 123/71 123/71  Pulse: 85 84  Temp: 36.4 C   Resp: 18 11    Last Pain:  Filed Vitals:   10/13/15 1207  PainSc: 3          Complications: No apparent anesthesia complications

## 2015-10-13 NOTE — Discharge Instructions (Addendum)
Lumpectomy, Care After  Refer to this sheet in the next few weeks. These instructions provide you with information on caring for yourself after your procedure. Your health care provider may also give you more specific instructions. Your treatment has been planned according to current medical practices, but problems sometimes occur. Call your health care provider if you have any problems or questions after your procedure.  WHAT TO EXPECT AFTER THE PROCEDURE  After your procedure, it is typical to have soreness, bruising, and swelling of your breast. This is normal. You will be given medicines to control your pain.  HOME CARE INSTRUCTIONS  Take medicines only as directed by your health care provider.  Resume a normal diet as directed by your health care provider.  Resume normal activity as directed by your health care provider. Avoid strenuous activity that affects the arm on the side that the surgical cut (incision) was made. Avoid playing tennis, swimming, lifting heavy objects (those that weigh more than 10 pounds [4.5 kg]), and pulling for 2 weeks.  Change bandages (dressings) as directed by your health care provider.  Consider wearing a bra to bed if you feel discomfort at the breast. Wearing a bra also helps keep dressings on.  Keep all follow-up visits as directed by your health care provider. This is important.  Call for the results of your procedure as instructed by your surgeon. It is your responsibility to get your test results. Do not assume everything is fine if you have not heard from your health care provider.  Keep the incision site dry.  If the incision site is tender, applying an ice pack may relieve some discomfort. To do this:  Put ice in a plastic bag.  Place a towel between your skin and the bag.  Leave the ice on for 15-20 minutes, 3-4 times a day.   Sentinel Lymph Node Biopsy in Breast Cancer Treatment, Care After   Refer to this sheet in the next few weeks. These  instructions provide you with information on caring for yourself after your procedure. Your health care provider may also give you more specific instructions. Your treatment has been planned according to current medical practices, but problems sometimes occur. Call your health care provider if you have any problems or questions after your procedure.   WHAT TO EXPECT AFTER THE PROCEDURE  After your procedure, it is typical to have the following:  Your urine may be blue for the next 24 hours. This is normal. It is caused by the dye used during the procedure.  Your skin at the injection site may be blue for up to 8 weeks.  You may feel numbness, tingling, or pain near your surgical cut (incision).  You may have swelling or bruising near your incision. HOME CARE INSTRUCTIONS  Avoid vigorous exercise. Ask your health care provider when you can return to your normal activities.  You may shower 24 hours after your procedure. It is okay to get your incision wet. Gently pat the incision dry after you shower.  If you are given a surgical bra, wear it for the next 48 hours. You may remove the bra to shower.  Do not remove skin adhesive strips over your incision. They will fall off on their own over time.  Take medicines only as directed by your health care provider.  You may resume your regular diet.  Do not have your blood pressure taken on the side you had the biopsy until your health care provider says it  is okay.  Keep all follow-up visits as directed by your health care provider.     SEEK MEDICAL CARE IF:  You have increased bleeding from the incision site.  You notice redness, swelling, or increasing pain in the incision.  You have pus coming from the incision site.  You have a fever.  You notice a foul smell coming from the incision or dressing. SEEK IMMEDIATE MEDICAL CARE IF:  You develop a rash.  You have shortness of breath.  You have chest pain. This information is not intended to  replace advice given to you by your health care provider. Make sure you discuss any questions you have with your health care provider.    General Anesthesia, Adult, Care After Refer to this sheet in the next few weeks. These instructions provide you with information on caring for yourself after your procedure. Your health care provider may also give you more specific instructions. Your treatment has been planned according to current medical practices, but problems sometimes occur. Call your health care provider if you have any problems or questions after your procedure. WHAT TO EXPECT AFTER THE PROCEDURE After the procedure, it is typical to experience:  Sleepiness.  Nausea and vomiting. HOME CARE INSTRUCTIONS  For the first 24 hours after general anesthesia:  Have a responsible person with you.  Do not drive a car. If you are alone, do not take public transportation.  Do not drink alcohol.  Do not take medicine that has not been prescribed by your health care provider.  Do not sign important papers or make important decisions.  You may resume a normal diet and activities as directed by your health care provider.  Change bandages (dressings) as directed.  If you have questions or problems that seem related to general anesthesia, call the hospital and ask for the anesthetist or anesthesiologist on call. SEEK MEDICAL CARE IF:  You have nausea and vomiting that continue the day after anesthesia.  You develop a rash. SEEK IMMEDIATE MEDICAL CARE IF:   You have difficulty breathing.  You have chest pain.  You have any allergic problems.   This information is not intended to replace advice given to you by your health care provider. Make sure you discuss any questions you have with your health care provider.   Document Released: 06/21/2000 Document Revised: 04/05/2014 Document Reviewed: 07/14/2011 Elsevier Interactive Patient Education [employer].

## 2015-10-13 NOTE — Anesthesia Postprocedure Evaluation (Signed)
Anesthesia Post Note  Patient: [name]  Procedure(s) Performed: Procedure(s) (LRB): BREAST LUMPECTOMY WITH NEEDLE LOCALIZATION AND AXILLARY LYMPH NODE DISSECTION (Right) SENTINEL NODE BIOPSY (Right)  Patient location during evaluation: PACU Anesthesia Type: General Level of consciousness: awake and alert and oriented Pain management: pain level controlled Vital Signs Assessment: vitals unstable and post-procedure vital signs reviewed and stable Respiratory status: spontaneous breathing, nonlabored ventilation, respiratory function stable and patient connected to nasal cannula oxygen Cardiovascular status: blood pressure returned to baseline and stable Postop Assessment: no signs of nausea or vomiting Anesthetic complications: no    Last Vitals:  Filed Vitals:   10/13/15 1219 10/13/15 1230  BP: 112/62   Pulse: 81 77  Temp:    Resp: 11 10    Last Pain:  Filed Vitals:   10/13/15 1238  PainSc: 4                  Teresa Silva

## 2015-10-13 NOTE — Op Note (Signed)
  Pre-operative Diagnosis:  DCIS right Breast     Post-operative Diagnosis: Same  Surgeon: Caroleen Hamman, MD FACS  Anesthesia: General, LMA  Procedure: Needle localization lumpectomy, sentinel node biopsy  Findings: xray of the specimen revealed clip and wire within the excised tissue.  Estimated Blood Loss: Minimal         Drains: None         Specimens: partial mastectomy with labels, long lateral and short superior; sentinel node   Procedure Details  The patient was seen again in the Holding Room. The benefits, complications, treatment options, and expected outcomes were discussed with the patient. The risks of bleeding, infection, recurrence of symptoms, failure to resolve symptoms, hematoma, seroma, open wound, cosmetic deformity, and the need for further surgery were discussed.  The patient was taken to Operating Room, identified as [name] and the procedure verified.  A Time Out was held and the above information confirmed.  Prior to the induction of general anesthesia, antibiotic prophylaxis was administered. VTE prophylaxis was in place. Appropriate anesthesia was then administered and tolerated well. The chest was prepped with Chloraprep and draped in the sterile fashion. The patient was positioned in the supine position.   A visual dye was injected periareolar early under aseptic conditions. Then using the hand-held probe an area of high counts was identified in the axilla, an incision was made and direction by the probe aided in dissection of a lymph node , there were three sentinel nodes that were both hot and blue that were sent for permanent pathology.  Attention was turned to the needle localization site where an incision was made inferior to the needle, sub q flaps were developed and the wire was delivered via the incision. Dissection around the needle to perform a partial mastectomy with adequate margins was performed. This was done with electrocautery . Hemostasis  was with electrocautery. Additional Marcaine was infiltrated into the skin and subcutaneous tissues of the cavity. Once assuring that hemostasis was adequate and checked multiple times the wound was closed with interrupted 3-0 Vicryl followed by 4-0 subcuticular Monocryl sutures.  The axillary wound was closed in a similar fashion.  I feel from the specimen was sent and review. The both the clips some microcalcifications and the wire water containing within the excised tissue. There was apparently a close margin anterior to the clip but this was where I performed the skin flaps so there was no further anterior margin to resect.   Dermabond was used to coat the skin incisions.   Caroleen Hamman, MD, FACS

## 2015-10-13 NOTE — Interval H&P Note (Signed)
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 After consideration of risks, benefits and other options for treatment, the patient has consented to  Procedure(s): BREAST LUMPECTOMY WITH NEEDLE LOCALIZATION AND AXILLARY LYMPH NODE DISSECTION (Right) SENTINEL NODE BIOPSY (Right) as a surgical intervention .  The patient's history has been reviewed, patient examined, no change in status, stable for surgery.  I have reviewed the patient's chart and labs.  Questions were answered to the patient's satisfaction.     [place]

## 2015-10-13 NOTE — H&P (View-Only) (Signed)
Surgical Consultation  09/18/2015  Teresa Silva is an 60 y.o. female.   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 U/S core bx  ER and PR negative. She denies any pain, any nipple discharge or any masses. She is healing very well from the biopsy site. She has good cardiovascular reserve and is able to perform more than 4 Mets of activity without any shortness of breath or chest pain. He has been 4 years since her last previous normal mammogram GYN History: Menarche  60 years old, 11 years ago. No evidence of exogenous estrogen use  Family Breast Cancer History: cousin with breast cancer no other breast cancer history and no history of ovarian cancer  History reviewed. No pertinent past medical history.  Past Surgical History  Procedure Laterality Date  . Breast biopsy Right 09/12/2015    stereo  . Colonoscopy  2009    no polyps    Family History  Problem Relation Age of Onset  . Hypertension Mother   . Lung cancer Mother   . Arthritis Mother   . Hypertension Father   . COPD Father   . Liver cancer Sister   . Breast cancer Cousin     Social History:  reports that she has never smoked. She does not have any smokeless tobacco history on file. She reports that she does not drink alcohol. Her drug history is not on file.  Allergies:  Allergies  Allergen Reactions  . Tramadol Hcl Other (See Comments)    Flu Like Symtom    Medications reviewed.   Review of Systems:   ROS 10 pt ROS negative  Physical Exam:  BP 146/83 mmHg  Pulse 94  Temp(Src) 98.3 F (36.8 C) (Oral)  Ht 5' 3.5" (1.613 m)  Wt 54.432 kg (120 lb)  BMI 20.92 kg/m2  Physical Exam NAD awake alert NEck: no LAD, NO JVD, trach midline Breast Exam: RIGHT 12 o'clock bx site 5 cms from nipple, no infection, NO masses, nipples and skin is normal. Normal left breast LYmph nodes: no  axillary or neck LAD RESP: lungs CTA CV: s1,s2, no murmurs ABD: soft, NT, no masses, no hernia EXT: well perfused , warm , no edema  NEURO: awake alert, no motor or sensory deficits. CN intact.   Assessment/Plan: 60 year old with newly diagnosed  DCIS on routine mammogram. Discussed in detail with her about breast cancer and multimodal therapy. Options about mastectomy versus lumpectomy plus radiation therapy were discussed at length. I also discussed with her about the role for sentinel lymph node biopsy although technically is a DCIS with a small fossae have also explained to her that there is a chance of the pathology being upgraded and then having to perform a sentinel lymph node biopsy. With that being said the patient prefers to go ahead and have her axillae stage with sentinel lymph node bx. We'll also make arrangements to see medical oncology and radiation oncology. If she chooses to have a lumpectomy she will require radiation therapy. Also discussed with her that chemotherapy would be based on her final pathology and on staging of her axilla. I have also discussed with her about the procedure, risk, benefits and possible complications including but not limited to: Bleeding, infection, reexcision, nerve injury, lymphedema. She understands and wishes to proceed. We will tentatively schedule her for July 17 for a right needle localization lumpectomy with sentinel lymph node biopsy.  All questions were answered and extensive counseling was provided  Jules Husbands MD FACS

## 2015-10-14 ENCOUNTER — Telehealth: Payer: Self-pay | Admitting: Surgery

## 2015-10-14 DIAGNOSIS — C50919 Malignant neoplasm of unspecified site of unspecified female breast: Secondary | ICD-10-CM

## 2015-10-14 HISTORY — DX: Malignant neoplasm of unspecified site of unspecified female breast: C50.919

## 2015-10-14 LAB — SURGICAL PATHOLOGY

## 2015-10-14 NOTE — Telephone Encounter (Signed)
D/w pt about path results

## 2015-10-21 ENCOUNTER — Encounter: Payer: Self-pay | Admitting: Radiation Oncology

## 2015-10-21 ENCOUNTER — Inpatient Hospital Stay: Payer: Federal, State, Local not specified - PPO | Attending: Oncology | Admitting: Oncology

## 2015-10-21 ENCOUNTER — Ambulatory Visit
Admission: RE | Admit: 2015-10-21 | Discharge: 2015-10-21 | Disposition: A | Discharge status: Home / Self Care | Payer: Federal, State, Local not specified - PPO | Source: Ambulatory Visit | Attending: Radiation Oncology | Admitting: Radiation Oncology

## 2015-10-21 VITALS — BP 128/80 | HR 91 | Temp 98.2°F | Resp 18 | Ht 63.0 in | Wt 118.6 lb

## 2015-10-21 DIAGNOSIS — Z801 Family history of malignant neoplasm of trachea, bronchus and lung: Secondary | ICD-10-CM | POA: Diagnosis not present

## 2015-10-21 DIAGNOSIS — M129 Arthropathy, unspecified: Secondary | ICD-10-CM | POA: Insufficient documentation

## 2015-10-21 DIAGNOSIS — Z800 Family history of malignant neoplasm of digestive organs: Secondary | ICD-10-CM | POA: Diagnosis not present

## 2015-10-21 DIAGNOSIS — K219 Gastro-esophageal reflux disease without esophagitis: Secondary | ICD-10-CM | POA: Insufficient documentation

## 2015-10-21 DIAGNOSIS — D0511 Intraductal carcinoma in situ of right breast: Secondary | ICD-10-CM | POA: Diagnosis present

## 2015-10-21 DIAGNOSIS — Z803 Family history of malignant neoplasm of breast: Secondary | ICD-10-CM | POA: Insufficient documentation

## 2015-10-21 DIAGNOSIS — Z510 Encounter for antineoplastic radiation therapy: Secondary | ICD-10-CM | POA: Diagnosis not present

## 2015-10-21 NOTE — Progress Notes (Signed)
  Oncology Nurse Navigator Documentation  Navigator Location: [facility] Onc (10/21/15 1400) Navigator Encounter Type: Initial RadOnc (10/21/15 1400)           Patient Visit Type: Initial;RadOnc (10/21/15 1400) Treatment Phase: Pre-Tx/Tx Discussion (10/21/15 1400) Barriers/Navigation Needs: Financial;Transportation;Education (10/21/15 1400) Education: Transport During Treatment;Concerns with Finances/ Eligibility;Concerns with Insurance Coverage (10/21/15 1400) Interventions: Transportations;Education Method;Coordination of Care (10/21/15 1400)   Coordination of Care: Appts (10/21/15 1400) Education Method: Verbal;Teach-back (10/21/15 1400)      Acuity: Level 2 (10/21/15 1400)   Acuity Level 2: Educational needs;Ongoing guidance and education throughout treatment as needed (10/21/15 1400)     Time Spent with Patient: 60 (10/21/15 1400)  Met patient , husband at initial Rad/Onc visit with Dr. Baruch Gouty.  Patient requests assistance with transportation due to distance she  has to travel for treatment.  Given gas voucher through [employer].  Worked with patient to have cancer insurance policy information completed.  Lula Olszewski to assist.

## 2015-10-21 NOTE — Progress Notes (Signed)
Except an outstanding is perfect of Radiation Oncology NEW PATIENT EVALUATION  Name: Teresa Silva  MRN: [id number]  Date:   10/21/2015     DOB: 29-Jun-1955   This 60 y.o. 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  REFERRING PHYSICIAN: Jules Husbands, MD  CHIEF COMPLAINT:  Chief Complaint  Patient presents with  . Breast Cancer    DIAGNOSIS: The encounter diagnosis was DCIS (ductal carcinoma in situ) of breast, right.   PREVIOUS INVESTIGATIONS:  Pathology reports reviewed Mammograms and ultrasound reviewed Clinical notes reviewed  HPI: Patient is a 60 year old female who presented with an abnormal mammogram of the right breast showing pleomorphic calcifications in the upper right breast 12:00 position spanning approxi-5 mm. This was confirmed on ultrasound. Patient underwent ultrasound-guided biopsy which was positive for ductal carcinoma in situ. She went on to have a wide local excision and sentinel node biopsy. Tumor was 1 cm grade 2 ductal carcinoma in situ ER/PR negative. 2 sentinel lymph nodes were negative for metastatic disease. Margins were clear but close at 2 mm. She's been quite sore postoperatively although is healing well. She specifically denies cough or bone pain. She is seen today accompanied by her husband and nurse navigator.  PLANNED TREATMENT REGIMEN: Hypofractionated course of whole breast radiation to her right breast  PAST MEDICAL HISTORY:  has a past medical history of Arthritis; Breast cancer ([place]); and GERD (gastroesophageal reflux disease).    PAST SURGICAL HISTORY:  Past Surgical History:  Procedure Laterality Date  . BREAST BIOPSY Right 09/12/2015   stereo  . BREAST EXCISIONAL BIOPSY Right 10/13/2015   lumpectomy DCIS  . BREAST LUMPECTOMY WITH NEEDLE LOCALIZATION AND AXILLARY LYMPH NODE DISSECTION Right 10/13/2015    Procedure: BREAST LUMPECTOMY WITH NEEDLE LOCALIZATION AND AXILLARY LYMPH NODE DISSECTION;  Surgeon: Jules Husbands, MD;  Location: [facility] ORS;  Service: General;  Laterality: Right;  . COLONOSCOPY  2009   no polyps  . SENTINEL NODE BIOPSY Right 10/13/2015   Procedure: SENTINEL NODE BIOPSY;  Surgeon: Jules Husbands, MD;  Location: [facility] ORS;  Service: General;  Laterality: Right;    FAMILY HISTORY: family history includes Arthritis in her mother; Breast cancer in her cousin; COPD in her father; Hypertension in her father and mother; Liver cancer in her sister; Lung cancer in her mother.  SOCIAL HISTORY:  reports that she has never smoked. She has never used smokeless tobacco. She reports that she drinks alcohol. She reports that she does not use drugs.  ALLERGIES: Tramadol hcl  MEDICATIONS:  Current Outpatient Prescriptions  Medication Sig Dispense Refill  . acetaminophen (TYLENOL) 325 MG tablet Take 650 mg by mouth every 6 (six) hours as needed for mild pain.     Marland Kitchen HYDROcodone-acetaminophen (NORCO/VICODIN) 5-325 MG tablet Take 1 tablet by mouth every 6 (six) hours as needed for moderate pain. 40 tablet 0  . Multiple Vitamin (MULTIVITAMIN) tablet Take 1 tablet by mouth daily.    Marland Kitchen oxyCODONE-acetaminophen (ROXICET) 5-325 MG tablet Take 1-2 tablets by mouth every 4 (four) hours as needed for severe pain. 30 tablet 0  . ranitidine (ZANTAC) 150 MG tablet Take 150 mg by mouth as needed for heartburn.     No current facility-administered medications for this encounter.     ECOG PERFORMANCE STATUS:  0 - Asymptomatic  REVIEW OF SYSTEMS:  Patient denies any weight  loss, fatigue, weakness, fever, chills or night sweats. Patient denies any loss of vision, blurred vision. Patient denies any ringing  of the ears or hearing loss. No irregular heartbeat. Patient denies heart murmur or history of fainting. Patient denies any chest pain or pain radiating to her upper extremities. Patient denies any shortness  of breath, difficulty breathing at night, cough or hemoptysis. Patient denies any swelling in the lower legs. Patient denies any nausea vomiting, vomiting of blood, or coffee ground material in the vomitus. Patient denies any stomach pain. Patient states has had normal bowel movements no significant constipation or diarrhea. Patient denies any dysuria, hematuria or significant nocturia. Patient denies any problems walking, swelling in the joints or loss of balance. Patient denies any skin changes, loss of hair or loss of weight. Patient denies any excessive worrying or anxiety or significant depression. Patient denies any problems with insomnia. Patient denies excessive thirst, polyuria, polydipsia. Patient denies any swollen glands, patient denies easy bruising or easy bleeding. Patient denies any recent infections, allergies or URI. Patient "s visual fields have not changed significantly in recent time.    PHYSICAL EXAM: BP 128/80   Pulse 91   Temp 98.2 F (36.8 C)   Resp 18   Ht 5\' 3"  (1.6 m)   Wt 118 lb 9.7 oz (53.8 kg)   BMI 21.01 kg/m  She is status post wide local excision and sentinel node biopsy of the right breast incision is healing well. Quite tender in the right axilla. No other dominant mass or nodularity is noted in either breast in 2 positions examined. No axillary or supraclavicular adenopathy is appreciated. Well-developed well-nourished patient in NAD. HEENT reveals PERLA, EOMI, discs not visualized.  Oral cavity is clear. No oral mucosal lesions are identified. Neck is clear without evidence of cervical or supraclavicular adenopathy. Lungs are clear to A&P. Cardiac examination is essentially unremarkable with regular rate and rhythm without murmur rub or thrill. Abdomen is benign with no organomegaly or masses noted. Motor sensory and DTR levels are equal and symmetric in the upper and lower extremities. Cranial nerves II through XII are grossly intact. Proprioception is intact. No  peripheral adenopathy or edema is identified. No motor or sensory levels are noted. Crude visual fields are within normal range.  LABORATORY DATA: Pathology reports reviewed    RADIOLOGY RESULTS: Mammograms and ultrasound reviewed   IMPRESSION: Stage 0 ductal carcinoma in situ ER/PR negative status post wide local excision and sentinel node biopsy in 60 year old female  PLAN: At this time I recommended whole breast radiation. Would treat in a hypofractionated course of treatment over 4 weeks. Would also boost her scar based on the close margin to 1600 cGy over 8 fractions using electron beam. Risks and benefits of treatment including skin reaction fatigue inclusion of some superficial lung alteration of blood counts all were discussed in detail with the patient and her husband. Patient will not be candidate for tamoxifen based on negative ER/PR status. I personally set up and ordered CT simulation after she heals another week and a half.  I would like to take this opportunity to thank you for allowing me to participate in the care of your patient.Armstead Peaks., MD

## 2015-10-23 NOTE — Progress Notes (Signed)
This encounter was created in error - please disregard.

## 2015-10-24 ENCOUNTER — Ambulatory Visit: Payer: Federal, State, Local not specified - PPO | Admitting: Surgery

## 2015-10-24 ENCOUNTER — Encounter: Payer: Self-pay | Admitting: Surgery

## 2015-10-24 ENCOUNTER — Ambulatory Visit (INDEPENDENT_AMBULATORY_CARE_PROVIDER_SITE_OTHER): Payer: Federal, State, Local not specified - PPO | Admitting: Surgery

## 2015-10-24 VITALS — BP 105/73 | HR 105 | Temp 98.1°F | Ht 63.0 in | Wt 118.2 lb

## 2015-10-24 DIAGNOSIS — Z09 Encounter for follow-up examination after completed treatment for conditions other than malignant neoplasm: Secondary | ICD-10-CM

## 2015-10-24 NOTE — Progress Notes (Signed)
S/p right lumpectomy and SLNbx for DCIS, final path w negative margins and 2 nodes were negative for mets. ER/PR nega She is doing well and her pain has improved significantly No issues today Path d/w pt in detail She will start radiation soon and has pending Onc appt  PE NAD Incisions healing very well, no infection, no lymphedema or collections  A/p doing very well after lumpectomy No further surgical intervention D/w pt in detail about path and post op expectations She is very appreciative

## 2015-10-24 NOTE — Patient Instructions (Signed)
Please call our office if you have any questions or concerns.  

## 2015-10-31 ENCOUNTER — Institutional Professional Consult (permissible substitution): Payer: Federal, State, Local not specified - PPO | Admitting: Radiation Oncology

## 2015-11-03 ENCOUNTER — Encounter: Payer: Self-pay | Admitting: Emergency Medicine

## 2015-11-03 ENCOUNTER — Emergency Department
Admission: EM | Admit: 2015-11-03 | Discharge: 2015-11-03 | Disposition: A | Discharge status: Home / Self Care | Payer: Federal, State, Local not specified - PPO | Attending: Emergency Medicine | Admitting: Emergency Medicine

## 2015-11-03 DIAGNOSIS — Z791 Long term (current) use of non-steroidal anti-inflammatories (NSAID): Secondary | ICD-10-CM | POA: Diagnosis not present

## 2015-11-03 DIAGNOSIS — Z853 Personal history of malignant neoplasm of breast: Secondary | ICD-10-CM | POA: Diagnosis not present

## 2015-11-03 DIAGNOSIS — R509 Fever, unspecified: Secondary | ICD-10-CM | POA: Diagnosis present

## 2015-11-03 DIAGNOSIS — N390 Urinary tract infection, site not specified: Secondary | ICD-10-CM | POA: Diagnosis not present

## 2015-11-03 LAB — BASIC METABOLIC PANEL
ANION GAP: 8 (ref 5–15)
BUN: 13 mg/dL (ref 6–20)
CHLORIDE: 105 mmol/L (ref 101–111)
CO2: 22 mmol/L (ref 22–32)
Calcium: 8.7 mg/dL — ABNORMAL LOW (ref 8.9–10.3)
Creatinine, Ser: 0.77 mg/dL (ref 0.44–1.00)
Glucose, Bld: 134 mg/dL — ABNORMAL HIGH (ref 65–99)
POTASSIUM: 3.5 mmol/L (ref 3.5–5.1)
SODIUM: 135 mmol/L (ref 135–145)

## 2015-11-03 LAB — URINALYSIS COMPLETE WITH MICROSCOPIC (ARMC ONLY)
Bilirubin Urine: NEGATIVE
GLUCOSE, UA: NEGATIVE mg/dL
NITRITE: NEGATIVE
PROTEIN: 30 mg/dL — AB
Specific Gravity, Urine: 1.021 (ref 1.005–1.030)
pH: 5 (ref 5.0–8.0)

## 2015-11-03 LAB — LACTIC ACID, PLASMA: LACTIC ACID, VENOUS: 0.9 mmol/L (ref 0.5–1.9)

## 2015-11-03 LAB — CBC
HEMATOCRIT: 34.2 % — AB (ref 35.0–47.0)
HEMOGLOBIN: 11.6 g/dL — AB (ref 12.0–16.0)
MCH: 30.2 pg (ref 26.0–34.0)
MCHC: 33.8 g/dL (ref 32.0–36.0)
MCV: 89.3 fL (ref 80.0–100.0)
Platelets: 222 10*3/uL (ref 150–440)
RBC: 3.84 MIL/uL (ref 3.80–5.20)
RDW: 12.6 % (ref 11.5–14.5)
WBC: 14.7 10*3/uL — ABNORMAL HIGH (ref 3.6–11.0)

## 2015-11-03 MED ORDER — CEFTRIAXONE SODIUM 1 G IJ SOLR
1.0000 g | Freq: Once | INTRAMUSCULAR | Status: AC
  Administered 2015-11-03: 1 g via INTRAVENOUS
  Filled 2015-11-03 (×2): qty 10

## 2015-11-03 MED ORDER — CEPHALEXIN 500 MG PO CAPS: 500.0000 mg | ORAL_CAPSULE | Freq: Four times a day (QID) | ORAL | 0 refills | Status: DC

## 2015-11-03 MED ORDER — IBUPROFEN 600 MG PO TABS
600.0000 mg | ORAL_TABLET | ORAL | Status: AC
  Administered 2015-11-03: 600 mg via ORAL
  Filled 2015-11-03: qty 1

## 2015-11-03 MED ORDER — SODIUM CHLORIDE 0.9 % IV BOLUS (SEPSIS)
1000.0000 mL | Freq: Once | INTRAVENOUS | Status: AC
  Administered 2015-11-03: 1000 mL via INTRAVENOUS

## 2015-11-03 NOTE — ED Notes (Signed)
Pt in via triage with complaints of generalized body aches, chills, fever, headache since yesterday morning.  Pt denies any N/V/D.  Pt denies any other symptoms.  Pt tachycardic, w/ low grade fever, other vitals WDL, no immediate distress at this time.  MD at bedside.

## 2015-11-03 NOTE — Discharge Instructions (Signed)

## 2015-11-03 NOTE — ED Triage Notes (Addendum)
Pt to triage via w/c with no distress noted 7/17, lumpectomy; st since yesterday having fever, frontal HA, body aches and nausea; 1 hydrocodone taken 2hrs PTA

## 2015-11-03 NOTE — ED Provider Notes (Signed)
[facility] Emergency Department Provider Note   ____________________________________________   First MD Initiated Contact with Patient 11/03/15 2141     (approximate)  I have reviewed the triage vital signs and the nursing notes.   HISTORY  Chief Complaint Fever    HPI Teresa Silva is a 60 y.o. female reports that she's been having chills and a fever for about the last 2 days.   Patient reports that for about the last couple of days she's been feeling fatigued, chilled with a temperature as high as 102. She reports that she's been having a slight burning with urination and increased amount of urinating. No nausea vomiting abdominal pain diarrhea. She had surgery a few weeks ago to remove lymph nodes under the right breast, but is not experiencing any issues pain or redness along that area. No cough or shortness of breath.  [place] but [place] have not noticed a tick bites. She is not having travel. No rash. No severe headache, no neck pain or stiffness.   Past Medical History:  Diagnosis Date  . Arthritis    BACK-LUMBAR  . Breast cancer ([place])   . GERD (gastroesophageal reflux disease)    OCC    Patient Active Problem List   Diagnosis Date Noted  . DCIS (ductal carcinoma in situ) of breast   . 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 Abdominal pain, epigastric 02/13/2014  . Degeneration of intervertebral disc of cervical region 02/07/2014  . Muscle ache 02/07/2014  . Back ache 02/07/2013  . Lumbar radiculopathy 02/07/2013    Past Surgical History:  Procedure Laterality Date  . BREAST BIOPSY Right 09/12/2015   stereo  . BREAST EXCISIONAL BIOPSY Right 10/13/2015   lumpectomy DCIS  . BREAST LUMPECTOMY WITH NEEDLE LOCALIZATION AND AXILLARY LYMPH NODE DISSECTION Right 10/13/2015   Procedure: BREAST LUMPECTOMY WITH NEEDLE LOCALIZATION AND AXILLARY LYMPH NODE DISSECTION;  Surgeon:  Jules Husbands, MD;  Location: [facility] ORS;  Service: General;  Laterality: Right;  . COLONOSCOPY  2009   no polyps  . SENTINEL NODE BIOPSY Right 10/13/2015   Procedure: SENTINEL NODE BIOPSY;  Surgeon: Jules Husbands, MD;  Location: [facility] ORS;  Service: General;  Laterality: Right;    Prior to Admission medications   Medication Sig Start Date End Date Taking? Authorizing Provider  acetaminophen (TYLENOL) 325 MG tablet Take 650 mg by mouth every 6 (six) hours as needed for mild pain.     Historical Provider, MD  cephALEXin (KEFLEX) 500 MG capsule Take 1 capsule (500 mg total) by mouth 4 (four) times daily. 11/03/15   Delman Kitten, MD  HYDROcodone-acetaminophen (NORCO/VICODIN) 5-325 MG tablet Take 1 tablet by mouth every 6 (six) hours as needed for moderate pain. Patient not taking: Reported on 10/24/2015 10/01/15   Lloyd Huger, MD  Multiple Vitamin (MULTIVITAMIN) tablet Take 1 tablet by mouth daily.    Historical Provider, MD  oxyCODONE-acetaminophen (ROXICET) 5-325 MG tablet Take 1-2 tablets by mouth every 4 (four) hours as needed for severe pain. Patient not taking: Reported on 10/24/2015 10/13/15   Jules Husbands, MD  ranitidine (ZANTAC) 150 MG tablet Take 150 mg by mouth as needed for heartburn.    Historical Provider, MD    Allergies Tramadol hcl  Family History  Problem Relation Age of Onset  . Hypertension Mother   . Lung cancer Mother   . Arthritis Mother   . Hypertension  Father   . COPD Father   . Liver cancer Sister   . Breast cancer Cousin     Social History Social History  Substance Use Topics  . Smoking status: Never Smoker  . Smokeless tobacco: Never Used  . Alcohol use Yes     Comment: RARE    Review of Systems Constitutional: Fevers, chills and fatigue. No lightheadedness or weakness other than feeling generally tired. Eyes: No visual changes. ENT: No sore throat. Cardiovascular: Denies chest pain. Respiratory: Denies shortness of breath. Gastrointestinal: No  abdominal pain.  No nausea, no vomiting.  No diarrhea.  No constipation. Genitourinary: See history of present illness Musculoskeletal: Negative for back pain. Skin: Negative for rash. Neurological: Negative for headaches, focal weakness or numbness.  10-point ROS otherwise negative.  ____________________________________________   PHYSICAL EXAM:  VITAL SIGNS: ED Triage Vitals  Enc Vitals Group     BP 11/03/15 1949 113/65     Pulse Rate 11/03/15 1949 (!) 130     Resp 11/03/15 1949 (!) 22     Temp 11/03/15 1949 99.4 F (37.4 C)     Temp Source 11/03/15 1949 Oral     SpO2 11/03/15 1949 96 %     Weight 11/03/15 1949 120 lb (54.4 kg)     Height 11/03/15 1949 5\' 3"  (1.6 m)     Head Circumference --      Peak Flow --      Pain Score 11/03/15 1952 10     Pain Loc --      Pain Edu? --      Excl. in [place]? --     Constitutional: Alert and oriented. Well appearing and in no acute distress. Eyes: Conjunctivae are normal. PERRL. EOMI. Head: Atraumatic.No photophobia Nose: No congestion/rhinnorhea. Mouth/Throat: Mucous membranes are moist.  Oropharynx non-erythematous. Neck: No stridor.  No cervical tenderness or rigidity. No meningismus. Cardiovascular: Slightly tachycardic rate, regular rhythm. Grossly normal heart sounds.  Good peripheral circulation. Respiratory: Normal respiratory effort.  No retractions. Lungs CTAB. Gastrointestinal: Soft and nontender. No distention. No abdominal bruits. No CVA tenderness. Musculoskeletal: No lower extremity tenderness nor edema.  No joint effusions. Neurologic:  Normal speech and language. No gross focal neurologic deficits are appreciated. No gait instability Skin:  Skin is warm, dry and intact. No rash noted. Psychiatric: Mood and affect are normal. Speech and behavior are normal.  ____________________________________________   LABS (all labs ordered are listed, but only abnormal results are displayed)  Labs Reviewed  CBC - Abnormal;  Notable for the following:       Result Value   WBC 14.7 (*)    Hemoglobin 11.6 (*)    HCT 34.2 (*)    All other components within normal limits  URINALYSIS COMPLETEWITH MICROSCOPIC ([facility] ONLY) - Abnormal; Notable for the following:    Color, Urine YELLOW (*)    APPearance HAZY (*)    Ketones, ur 1+ (*)    Hgb urine dipstick 3+ (*)    Protein, ur 30 (*)    Leukocytes, UA 1+ (*)    Bacteria, UA RARE (*)    Squamous Epithelial / LPF 0-5 (*)    All other components within normal limits  BASIC METABOLIC PANEL - Abnormal; Notable for the following:    Glucose, Bld 134 (*)    Calcium 8.7 (*)    All other components within normal limits  CULTURE, BLOOD (ROUTINE X 2)  CULTURE, BLOOD (ROUTINE X 2)  URINE CULTURE  LACTIC ACID, PLASMA  LACTIC ACID, PLASMA   ____________________________________________  EKG   ____________________________________________  RADIOLOGY  I see no indication for imaging. No focal deficits or neurologic complaints. No shortness of breath, no cough, no respiratory symptoms. Normal pulse oximetry and work of breathing. ____________________________________________   PROCEDURES  Procedure(s) performed: None  Procedures  Critical Care performed: No  ____________________________________________   INITIAL IMPRESSION / ASSESSMENT AND PLAN / ED COURSE  Pertinent labs & imaging results that were available during my care of the patient were reviewed by me and considered in my medical decision making (see chart for details).  Patient presents for fever, fatigue also associated with urinary symptoms. Labs indicate urinary tract infection, based on local resistances we'll initiate Rocephin and culture. Discharged on cephalexin as advised and recommended presently by our infectious disease physician Dr. Ola Spurr.  The patient's labs do show leukocytosis, and she presents with tachycardia and low-grade fever. Her symptoms are concerning for mild sepsis,  however appears a very focal source and she is not having any systemic symptoms other than chills and fever. She is not having abdominal pain nausea or vomiting and is tolerating by mouth well. I discussed with the patient and her husband, and I offered an noted to her that I did recommend admission however she and her husband reports that she really feels that she would like to go home with oral antibiotic. I discussed with her and told her we would reevaluate after receiving fluids. After ibuprofen and fluids her heart rate is improved to the 90s, she reports feeling much better, and she is awake alert and well-appearing.  I did again offer admission, but after shared medical decision making and discussion of treatment plan (risks and benefits) and close return precautions with patient and her husband, they would like to be able to go home and treat with antibiotics. I think this is a reasonable plan. She has a prescription for Zofran at home already. They will return to the emergency room if she is not improving, she has developed pain, nausea, vomiting, worsening symptoms of fatigue weakness or headache or other new concerns arise.  Clinical Course     ____________________________________________   FINAL CLINICAL IMPRESSION(S) / ED DIAGNOSES  Final diagnoses:  Urinary tract infection, acute      NEW MEDICATIONS STARTED DURING THIS VISIT:  New Prescriptions   CEPHALEXIN (KEFLEX) 500 MG CAPSULE    Take 1 capsule (500 mg total) by mouth 4 (four) times daily.     Note:  This document was prepared using Dragon voice recognition software and may include unintentional dictation errors.     Delman Kitten, MD 11/03/15 [phone]

## 2015-11-04 ENCOUNTER — Ambulatory Visit: Payer: Federal, State, Local not specified - PPO

## 2015-11-05 ENCOUNTER — Ambulatory Visit: Payer: Federal, State, Local not specified - PPO

## 2015-11-06 LAB — URINE CULTURE
Culture: >=100000 — AB
Special Requests: NORMAL

## 2015-11-07 ENCOUNTER — Ambulatory Visit
Admission: RE | Admit: 2015-11-07 | Discharge: 2015-11-07 | Disposition: A | Discharge status: Home / Self Care | Payer: Federal, State, Local not specified - PPO | Source: Ambulatory Visit | Attending: Radiation Oncology | Admitting: Radiation Oncology

## 2015-11-07 DIAGNOSIS — D0511 Intraductal carcinoma in situ of right breast: Secondary | ICD-10-CM | POA: Diagnosis not present

## 2015-11-08 LAB — CULTURE, BLOOD (ROUTINE X 2): CULTURE: NO GROWTH

## 2015-11-12 DIAGNOSIS — D0511 Intraductal carcinoma in situ of right breast: Secondary | ICD-10-CM | POA: Diagnosis not present

## 2015-11-13 ENCOUNTER — Other Ambulatory Visit: Payer: Self-pay | Admitting: *Deleted

## 2015-11-13 DIAGNOSIS — D0511 Intraductal carcinoma in situ of right breast: Secondary | ICD-10-CM | POA: Diagnosis not present

## 2015-11-17 ENCOUNTER — Ambulatory Visit
Admission: RE | Admit: 2015-11-17 | Discharge: 2015-11-17 | Disposition: A | Discharge status: Home / Self Care | Payer: Federal, State, Local not specified - PPO | Source: Ambulatory Visit | Attending: Radiation Oncology | Admitting: Radiation Oncology

## 2015-11-17 DIAGNOSIS — D0511 Intraductal carcinoma in situ of right breast: Secondary | ICD-10-CM | POA: Diagnosis not present

## 2015-11-18 ENCOUNTER — Ambulatory Visit
Admission: RE | Admit: 2015-11-18 | Discharge: 2015-11-18 | Disposition: A | Discharge status: Home / Self Care | Payer: Federal, State, Local not specified - PPO | Source: Ambulatory Visit | Attending: Radiation Oncology | Admitting: Radiation Oncology

## 2015-11-18 DIAGNOSIS — D0511 Intraductal carcinoma in situ of right breast: Secondary | ICD-10-CM | POA: Diagnosis not present

## 2015-11-19 ENCOUNTER — Ambulatory Visit
Admission: RE | Admit: 2015-11-19 | Discharge: 2015-11-19 | Disposition: A | Discharge status: Home / Self Care | Payer: Federal, State, Local not specified - PPO | Source: Ambulatory Visit | Attending: Radiation Oncology | Admitting: Radiation Oncology

## 2015-11-19 DIAGNOSIS — D0511 Intraductal carcinoma in situ of right breast: Secondary | ICD-10-CM | POA: Diagnosis not present

## 2015-11-20 ENCOUNTER — Ambulatory Visit
Admission: RE | Admit: 2015-11-20 | Discharge: 2015-11-20 | Disposition: A | Discharge status: Home / Self Care | Payer: Federal, State, Local not specified - PPO | Source: Ambulatory Visit | Attending: Radiation Oncology | Admitting: Radiation Oncology

## 2015-11-20 DIAGNOSIS — D0511 Intraductal carcinoma in situ of right breast: Secondary | ICD-10-CM | POA: Diagnosis not present

## 2015-11-21 ENCOUNTER — Ambulatory Visit
Admission: RE | Admit: 2015-11-21 | Discharge: 2015-11-21 | Disposition: A | Discharge status: Home / Self Care | Payer: Federal, State, Local not specified - PPO | Source: Ambulatory Visit | Attending: Radiation Oncology | Admitting: Radiation Oncology

## 2015-11-21 DIAGNOSIS — D0511 Intraductal carcinoma in situ of right breast: Secondary | ICD-10-CM | POA: Diagnosis not present

## 2015-11-24 ENCOUNTER — Ambulatory Visit
Admission: RE | Admit: 2015-11-24 | Discharge: 2015-11-24 | Disposition: A | Discharge status: Home / Self Care | Payer: Federal, State, Local not specified - PPO | Source: Ambulatory Visit | Attending: Radiation Oncology | Admitting: Radiation Oncology

## 2015-11-24 DIAGNOSIS — D0511 Intraductal carcinoma in situ of right breast: Secondary | ICD-10-CM | POA: Diagnosis not present

## 2015-11-25 ENCOUNTER — Ambulatory Visit
Admission: RE | Admit: 2015-11-25 | Discharge: 2015-11-25 | Disposition: A | Discharge status: Home / Self Care | Payer: Federal, State, Local not specified - PPO | Source: Ambulatory Visit | Attending: Radiation Oncology | Admitting: Radiation Oncology

## 2015-11-25 DIAGNOSIS — D0511 Intraductal carcinoma in situ of right breast: Secondary | ICD-10-CM | POA: Diagnosis not present

## 2015-11-26 ENCOUNTER — Ambulatory Visit
Admission: RE | Admit: 2015-11-26 | Discharge: 2015-11-26 | Disposition: A | Discharge status: Home / Self Care | Payer: Federal, State, Local not specified - PPO | Source: Ambulatory Visit | Attending: Radiation Oncology | Admitting: Radiation Oncology

## 2015-11-26 DIAGNOSIS — D0511 Intraductal carcinoma in situ of right breast: Secondary | ICD-10-CM | POA: Diagnosis not present

## 2015-11-27 ENCOUNTER — Ambulatory Visit
Admission: RE | Admit: 2015-11-27 | Discharge: 2015-11-27 | Disposition: A | Discharge status: Home / Self Care | Payer: Federal, State, Local not specified - PPO | Source: Ambulatory Visit | Attending: Radiation Oncology | Admitting: Radiation Oncology

## 2015-11-27 DIAGNOSIS — D0511 Intraductal carcinoma in situ of right breast: Secondary | ICD-10-CM | POA: Diagnosis not present

## 2015-11-28 ENCOUNTER — Ambulatory Visit
Admission: RE | Admit: 2015-11-28 | Discharge: 2015-11-28 | Disposition: A | Discharge status: Home / Self Care | Payer: Federal, State, Local not specified - PPO | Source: Ambulatory Visit | Attending: Radiation Oncology | Admitting: Radiation Oncology

## 2015-11-28 DIAGNOSIS — D0511 Intraductal carcinoma in situ of right breast: Secondary | ICD-10-CM | POA: Diagnosis not present

## 2015-12-02 ENCOUNTER — Other Ambulatory Visit: Payer: Self-pay

## 2015-12-02 ENCOUNTER — Ambulatory Visit
Admission: RE | Admit: 2015-12-02 | Discharge: 2015-12-02 | Disposition: A | Discharge status: Home / Self Care | Payer: Federal, State, Local not specified - PPO | Source: Ambulatory Visit | Attending: Radiation Oncology | Admitting: Radiation Oncology

## 2015-12-02 ENCOUNTER — Inpatient Hospital Stay: Payer: Federal, State, Local not specified - PPO | Attending: Radiation Oncology

## 2015-12-02 DIAGNOSIS — Z801 Family history of malignant neoplasm of trachea, bronchus and lung: Secondary | ICD-10-CM | POA: Insufficient documentation

## 2015-12-02 DIAGNOSIS — Z803 Family history of malignant neoplasm of breast: Secondary | ICD-10-CM | POA: Diagnosis not present

## 2015-12-02 DIAGNOSIS — Z923 Personal history of irradiation: Secondary | ICD-10-CM | POA: Diagnosis not present

## 2015-12-02 DIAGNOSIS — K219 Gastro-esophageal reflux disease without esophagitis: Secondary | ICD-10-CM | POA: Diagnosis not present

## 2015-12-02 DIAGNOSIS — M129 Arthropathy, unspecified: Secondary | ICD-10-CM | POA: Diagnosis not present

## 2015-12-02 DIAGNOSIS — D0511 Intraductal carcinoma in situ of right breast: Secondary | ICD-10-CM | POA: Diagnosis not present

## 2015-12-02 DIAGNOSIS — Z800 Family history of malignant neoplasm of digestive organs: Secondary | ICD-10-CM | POA: Insufficient documentation

## 2015-12-02 DIAGNOSIS — Z79899 Other long term (current) drug therapy: Secondary | ICD-10-CM | POA: Insufficient documentation

## 2015-12-02 DIAGNOSIS — Z171 Estrogen receptor negative status [ER-]: Secondary | ICD-10-CM | POA: Insufficient documentation

## 2015-12-02 LAB — CBC
HEMATOCRIT: 35.3 % (ref 35.0–47.0)
HEMOGLOBIN: 12 g/dL (ref 12.0–16.0)
MCH: 30.3 pg (ref 26.0–34.0)
MCHC: 33.9 g/dL (ref 32.0–36.0)
MCV: 89.1 fL (ref 80.0–100.0)
Platelets: 185 10*3/uL (ref 150–440)
RBC: 3.96 MIL/uL (ref 3.80–5.20)
RDW: 12.9 % (ref 11.5–14.5)
WBC: 6 10*3/uL (ref 3.6–11.0)

## 2015-12-03 ENCOUNTER — Ambulatory Visit
Admission: RE | Admit: 2015-12-03 | Discharge: 2015-12-03 | Disposition: A | Discharge status: Home / Self Care | Payer: Federal, State, Local not specified - PPO | Source: Ambulatory Visit | Attending: Radiation Oncology | Admitting: Radiation Oncology

## 2015-12-03 DIAGNOSIS — D0511 Intraductal carcinoma in situ of right breast: Secondary | ICD-10-CM | POA: Diagnosis not present

## 2015-12-04 ENCOUNTER — Ambulatory Visit
Admission: RE | Admit: 2015-12-04 | Discharge: 2015-12-04 | Disposition: A | Discharge status: Home / Self Care | Payer: Federal, State, Local not specified - PPO | Source: Ambulatory Visit | Attending: Radiation Oncology | Admitting: Radiation Oncology

## 2015-12-04 DIAGNOSIS — D0511 Intraductal carcinoma in situ of right breast: Secondary | ICD-10-CM | POA: Diagnosis not present

## 2015-12-05 ENCOUNTER — Ambulatory Visit
Admission: RE | Admit: 2015-12-05 | Discharge: 2015-12-05 | Disposition: A | Discharge status: Home / Self Care | Payer: Federal, State, Local not specified - PPO | Source: Ambulatory Visit | Attending: Radiation Oncology | Admitting: Radiation Oncology

## 2015-12-05 DIAGNOSIS — D0511 Intraductal carcinoma in situ of right breast: Secondary | ICD-10-CM | POA: Diagnosis not present

## 2015-12-08 ENCOUNTER — Ambulatory Visit
Admission: RE | Admit: 2015-12-08 | Discharge: 2015-12-08 | Disposition: A | Discharge status: Home / Self Care | Payer: Federal, State, Local not specified - PPO | Source: Ambulatory Visit | Attending: Radiation Oncology | Admitting: Radiation Oncology

## 2015-12-08 DIAGNOSIS — D0511 Intraductal carcinoma in situ of right breast: Secondary | ICD-10-CM | POA: Diagnosis not present

## 2015-12-09 ENCOUNTER — Ambulatory Visit
Admission: RE | Admit: 2015-12-09 | Discharge: 2015-12-09 | Disposition: A | Discharge status: Home / Self Care | Payer: Federal, State, Local not specified - PPO | Source: Ambulatory Visit | Attending: Radiation Oncology | Admitting: Radiation Oncology

## 2015-12-09 DIAGNOSIS — D0511 Intraductal carcinoma in situ of right breast: Secondary | ICD-10-CM | POA: Diagnosis not present

## 2015-12-10 ENCOUNTER — Ambulatory Visit
Admission: RE | Admit: 2015-12-10 | Discharge: 2015-12-10 | Disposition: A | Discharge status: Home / Self Care | Payer: Federal, State, Local not specified - PPO | Source: Ambulatory Visit | Attending: Radiation Oncology | Admitting: Radiation Oncology

## 2015-12-10 DIAGNOSIS — D0511 Intraductal carcinoma in situ of right breast: Secondary | ICD-10-CM | POA: Diagnosis not present

## 2015-12-11 ENCOUNTER — Ambulatory Visit
Admission: RE | Admit: 2015-12-11 | Discharge: 2015-12-11 | Disposition: A | Discharge status: Home / Self Care | Payer: Federal, State, Local not specified - PPO | Source: Ambulatory Visit | Attending: Radiation Oncology | Admitting: Radiation Oncology

## 2015-12-11 DIAGNOSIS — D0511 Intraductal carcinoma in situ of right breast: Secondary | ICD-10-CM | POA: Diagnosis not present

## 2015-12-12 ENCOUNTER — Ambulatory Visit
Admission: RE | Admit: 2015-12-12 | Discharge: 2015-12-12 | Disposition: A | Discharge status: Home / Self Care | Payer: Federal, State, Local not specified - PPO | Source: Ambulatory Visit | Attending: Radiation Oncology | Admitting: Radiation Oncology

## 2015-12-12 DIAGNOSIS — D0511 Intraductal carcinoma in situ of right breast: Secondary | ICD-10-CM | POA: Diagnosis not present

## 2015-12-15 ENCOUNTER — Ambulatory Visit
Admission: RE | Admit: 2015-12-15 | Discharge: 2015-12-15 | Disposition: A | Discharge status: Home / Self Care | Payer: Federal, State, Local not specified - PPO | Source: Ambulatory Visit | Attending: Radiation Oncology | Admitting: Radiation Oncology

## 2015-12-15 DIAGNOSIS — D0511 Intraductal carcinoma in situ of right breast: Secondary | ICD-10-CM | POA: Diagnosis not present

## 2015-12-16 ENCOUNTER — Inpatient Hospital Stay: Payer: Federal, State, Local not specified - PPO

## 2015-12-16 ENCOUNTER — Ambulatory Visit
Admission: RE | Admit: 2015-12-16 | Discharge: 2015-12-16 | Disposition: A | Discharge status: Home / Self Care | Payer: Federal, State, Local not specified - PPO | Source: Ambulatory Visit | Attending: Radiation Oncology | Admitting: Radiation Oncology

## 2015-12-16 DIAGNOSIS — D0511 Intraductal carcinoma in situ of right breast: Secondary | ICD-10-CM | POA: Diagnosis not present

## 2015-12-16 LAB — CBC
HCT: 35 % (ref 35.0–47.0)
HEMOGLOBIN: 11.9 g/dL — AB (ref 12.0–16.0)
MCH: 30.2 pg (ref 26.0–34.0)
MCHC: 34.1 g/dL (ref 32.0–36.0)
MCV: 88.5 fL (ref 80.0–100.0)
PLATELETS: 200 10*3/uL (ref 150–440)
RBC: 3.95 MIL/uL (ref 3.80–5.20)
RDW: 13.1 % (ref 11.5–14.5)
WBC: 5.5 10*3/uL (ref 3.6–11.0)

## 2015-12-17 ENCOUNTER — Ambulatory Visit
Admission: RE | Admit: 2015-12-17 | Discharge: 2015-12-17 | Disposition: A | Discharge status: Home / Self Care | Payer: Federal, State, Local not specified - PPO | Source: Ambulatory Visit | Attending: Radiation Oncology | Admitting: Radiation Oncology

## 2015-12-17 DIAGNOSIS — D0511 Intraductal carcinoma in situ of right breast: Secondary | ICD-10-CM | POA: Diagnosis not present

## 2015-12-18 ENCOUNTER — Ambulatory Visit
Admission: RE | Admit: 2015-12-18 | Discharge: 2015-12-18 | Disposition: A | Discharge status: Home / Self Care | Payer: Federal, State, Local not specified - PPO | Source: Ambulatory Visit | Attending: Radiation Oncology | Admitting: Radiation Oncology

## 2015-12-18 DIAGNOSIS — D0511 Intraductal carcinoma in situ of right breast: Secondary | ICD-10-CM | POA: Diagnosis not present

## 2015-12-19 ENCOUNTER — Ambulatory Visit
Admission: RE | Admit: 2015-12-19 | Discharge: 2015-12-19 | Disposition: A | Discharge status: Home / Self Care | Payer: Federal, State, Local not specified - PPO | Source: Ambulatory Visit | Attending: Radiation Oncology | Admitting: Radiation Oncology

## 2015-12-19 DIAGNOSIS — D0511 Intraductal carcinoma in situ of right breast: Secondary | ICD-10-CM | POA: Diagnosis not present

## 2015-12-22 ENCOUNTER — Ambulatory Visit
Admission: RE | Admit: 2015-12-22 | Discharge: 2015-12-22 | Disposition: A | Discharge status: Home / Self Care | Payer: Federal, State, Local not specified - PPO | Source: Ambulatory Visit | Attending: Radiation Oncology | Admitting: Radiation Oncology

## 2015-12-22 DIAGNOSIS — D0511 Intraductal carcinoma in situ of right breast: Secondary | ICD-10-CM | POA: Diagnosis not present

## 2015-12-23 NOTE — Progress Notes (Signed)
[place]  [phone] [phone]) [phone]  ID: Teresa Silva OB: 1955/12/19  MR#: [id number]  [id number]  Patient Care Team: [facility] as PCP - General  CHIEF COMPLAINT: ER/PR negative right breast DCIS.  INTERVAL HISTORY: Patient returns to clinic today for further evaluation. Since her last clinic visit she had undergone lumpectomy confirming noninvasive lesion as well as completed adjuvant XRT. She currently feels well and is asymptomatic. She has no neurologic complaints. She denies any recent fevers. She has a good appetite and denies weight loss. She denies any pain. She has no chest pain or shortness of breath. She denies any nausea, vomiting, constipation, or diarrhea. She has no urinary complaints. Patient feels at her baseline and offers no further specific complaints today.  REVIEW OF SYSTEMS:   Review of Systems  Constitutional: Negative.  Negative for fever, malaise/fatigue and weight loss.  Respiratory: Negative.  Negative for sputum production.   Cardiovascular: Negative.  Negative for chest pain.  Gastrointestinal: Negative.  Negative for abdominal pain.  Genitourinary: Negative.   Musculoskeletal: Negative.   Neurological: Negative.  Negative for weakness.  Psychiatric/Behavioral: The patient is not nervous/anxious.     As per HPI. Otherwise, a complete review of systems is negative.  PAST MEDICAL HISTORY: Past Medical History:  Diagnosis Date  . Arthritis    BACK-LUMBAR  . [place] ([place])   . GERD (gastroesophageal reflux disease)    OCC    PAST SURGICAL HISTORY: Past Surgical History:  Procedure Laterality Date  . BREAST BIOPSY Right 09/12/2015   stereo  . BREAST EXCISIONAL BIOPSY Right 10/13/2015   lumpectomy DCIS  . BREAST LUMPECTOMY WITH NEEDLE LOCALIZATION AND AXILLARY LYMPH NODE DISSECTION Right 10/13/2015   Procedure: BREAST LUMPECTOMY WITH NEEDLE LOCALIZATION AND AXILLARY LYMPH NODE  DISSECTION;  Surgeon: Jules Husbands, MD;  Location: [facility] ORS;  Service: General;  Laterality: Right;  . COLONOSCOPY  2009   no polyps  . SENTINEL NODE BIOPSY Right 10/13/2015   Procedure: SENTINEL NODE BIOPSY;  Surgeon: Jules Husbands, MD;  Location: [facility] ORS;  Service: General;  Laterality: Right;    FAMILY HISTORY Family History  Problem Relation Age of Onset  . Hypertension Mother   . Lung cancer Mother   . Arthritis Mother   . Hypertension Father   . COPD Father   . Liver cancer Sister   . Breast cancer Cousin        ADVANCED DIRECTIVES:    HEALTH MAINTENANCE: Social History  Substance Use Topics  . Smoking status: Never Smoker  . Smokeless tobacco: Never Used  . Alcohol use Yes     Comment: RARE     Colonoscopy:  PAP:  Bone density:  Lipid panel:  Allergies  Allergen Reactions  . Tramadol Hcl Other (See Comments)    Flu Like Symtom    Current Outpatient Prescriptions  Medication Sig Dispense Refill  . acetaminophen (TYLENOL) 325 MG tablet Take 650 mg by mouth every 6 (six) hours as needed for mild pain.     . Multiple Vitamin (MULTIVITAMIN) tablet Take 1 tablet by mouth daily.    . ranitidine (ZANTAC) 150 MG tablet Take 150 mg by mouth as needed for heartburn.     No current facility-administered medications for this visit.     OBJECTIVE: Vitals:   12/24/15 0902  BP: 125/80  Pulse: 93  Resp: 18  Temp: 97.8 F (36.6 C)     Body mass index is 21.89 kg/m.  ECOG FS:0 - Asymptomatic  General: Well-developed, well-nourished, no acute distress. Eyes: Pink conjunctiva, anicteric sclera. Breasts: Patient requested exam be deferred today. Lungs: Clear to auscultation bilaterally. Heart: Regular rate and rhythm. No rubs, murmurs, or gallops. Abdomen: Soft, nontender, nondistended. No organomegaly noted, normoactive bowel sounds. Musculoskeletal: No edema, cyanosis, or clubbing. Neuro: Alert, answering all questions appropriately. Cranial nerves grossly  intact. Skin: No rashes or petechiae noted. Psych: Normal affect.   LAB RESULTS:  Lab Results  Component Value Date   NA 135 11/03/2015   K 3.5 11/03/2015   CL 105 11/03/2015   CO2 22 11/03/2015   GLUCOSE 134 (H) 11/03/2015   BUN 13 11/03/2015   CREATININE 0.77 11/03/2015   CALCIUM 8.7 (L) 11/03/2015   PROT 7.0 04/27/2013   ALBUMIN 3.6 04/27/2013   AST 17 04/27/2013   ALT 17 04/27/2013   ALKPHOS 77 04/27/2013   BILITOT 0.2 04/27/2013   GFRNONAA >60 11/03/2015   GFRAA >60 11/03/2015    Lab Results  Component Value Date   WBC 5.5 12/16/2015   HGB 11.9 (L) 12/16/2015   HCT 35.0 12/16/2015   MCV 88.5 12/16/2015   PLT 200 12/16/2015     STUDIES: No results found.  ASSESSMENT: Right breast DCIS, ER/PR negative.  PLAN:    1. Right breast DCIS: Pathology from biopsy did not have an invasive component and was also ER/PR negative which is unusual for DCIS. Patient has now completed her lumpectomy and adjuvant XRT. No further intervention is needed at this time. Tamoxifen will not benefit the patient given the ER/PR status of her DCIS. Return to clinic in 6 months with repeat mammogram and further evaluation.   Patient expressed understanding and was in agreement with this plan. She also understands that She can call clinic at any time with any questions, concerns, or complaints.     Lloyd Huger, MD   12/24/2015 10:07 AM

## 2015-12-24 ENCOUNTER — Inpatient Hospital Stay (HOSPITAL_BASED_OUTPATIENT_CLINIC_OR_DEPARTMENT_OTHER): Payer: Federal, State, Local not specified - PPO | Admitting: Oncology

## 2015-12-24 VITALS — BP 125/80 | HR 93 | Temp 97.8°F | Resp 18 | Wt 123.6 lb

## 2015-12-24 DIAGNOSIS — D0511 Intraductal carcinoma in situ of right breast: Secondary | ICD-10-CM

## 2015-12-24 DIAGNOSIS — M129 Arthropathy, unspecified: Secondary | ICD-10-CM | POA: Diagnosis not present

## 2015-12-24 DIAGNOSIS — Z803 Family history of malignant neoplasm of breast: Secondary | ICD-10-CM

## 2015-12-24 DIAGNOSIS — Z171 Estrogen receptor negative status [ER-]: Secondary | ICD-10-CM | POA: Diagnosis not present

## 2015-12-24 DIAGNOSIS — Z800 Family history of malignant neoplasm of digestive organs: Secondary | ICD-10-CM

## 2015-12-24 DIAGNOSIS — Z801 Family history of malignant neoplasm of trachea, bronchus and lung: Secondary | ICD-10-CM

## 2015-12-24 DIAGNOSIS — K219 Gastro-esophageal reflux disease without esophagitis: Secondary | ICD-10-CM | POA: Diagnosis not present

## 2015-12-24 DIAGNOSIS — Z923 Personal history of irradiation: Secondary | ICD-10-CM

## 2015-12-24 DIAGNOSIS — Z79899 Other long term (current) drug therapy: Secondary | ICD-10-CM

## 2015-12-24 NOTE — Progress Notes (Signed)
States is feeling well. Offers no complaints. Finished radiation treatment and tolerated well.

## 2016-01-26 ENCOUNTER — Ambulatory Visit
Admission: RE | Admit: 2016-01-26 | Discharge: 2016-01-26 | Disposition: A | Discharge status: Home / Self Care | Payer: Federal, State, Local not specified - PPO | Source: Ambulatory Visit | Attending: Radiation Oncology | Admitting: Radiation Oncology

## 2016-01-26 ENCOUNTER — Encounter: Payer: Self-pay | Admitting: Radiation Oncology

## 2016-01-26 VITALS — BP 133/83 | HR 101 | Temp 98.0°F | Resp 20 | Wt 124.4 lb

## 2016-01-26 DIAGNOSIS — Z171 Estrogen receptor negative status [ER-]: Secondary | ICD-10-CM | POA: Insufficient documentation

## 2016-01-26 DIAGNOSIS — D0511 Intraductal carcinoma in situ of right breast: Secondary | ICD-10-CM | POA: Diagnosis not present

## 2016-01-26 DIAGNOSIS — Z923 Personal history of irradiation: Secondary | ICD-10-CM | POA: Insufficient documentation

## 2016-01-26 NOTE — Progress Notes (Signed)
Radiation Oncology Follow up Note  Name: Teresa Silva   Date:   01/26/2016 MRN:  [id number] DOB: 30-Nov-1955    This 60 y.o. 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 She is seen today in routine follow-up and is doing well. She specifically denies breast tenderness cough or bone pain. Her tumor was ER/PR negative she is not on tamoxifen.  COMPLICATIONS OF TREATMENT: none  FOLLOW UP COMPLIANCE: keeps appointments   PHYSICAL EXAM:  BP 133/83   Pulse (!) 101   Temp 98 F (36.7 C)   Resp 20   Wt 124 lb 7.2 oz (56.4 kg)   BMI 22.05 kg/m  Lungs are clear to A&P cardiac examination essentially unremarkable with regular rate and rhythm. No dominant mass or nodularity is noted in either breast in 2 positions examined. Incision is well-healed. No axillary or supraclavicular adenopathy is appreciated. Cosmetic result is excellent. Well-developed well-nourished patient in NAD. HEENT reveals PERLA, EOMI, discs not visualized.  Oral cavity is clear. No oral mucosal lesions are identified. Neck is clear without evidence of cervical or supraclavicular adenopathy. Lungs are clear to A&P. Cardiac examination is essentially unremarkable with regular rate and rhythm without murmur rub or thrill. Abdomen is benign with no organomegaly or masses noted. Motor sensory and DTR levels are equal and symmetric in the upper and lower extremities. Cranial nerves II through XII are grossly intact. Proprioception is intact. No peripheral adenopathy or edema is identified. No motor or sensory levels are noted. Crude visual fields are within normal range.  RADIOLOGY RESULTS: No current films for review  PLAN: At  the present time she is doing well 1 month out. She is not on tamoxifen therapy based on ER/PR negative nature of her disease. I've asked to see her back in 4-5 months for follow-up. Patient knows to call sooner with any concerns.  I would like to take this opportunity to thank you for allowing me to participate in the care of your patient.Armstead Peaks., MD

## 2016-06-23 ENCOUNTER — Ambulatory Visit
Admission: RE | Admit: 2016-06-23 | Discharge: 2016-06-23 | Disposition: A | Discharge status: Home / Self Care | Payer: Federal, State, Local not specified - PPO | Source: Ambulatory Visit | Attending: Oncology | Admitting: Oncology

## 2016-06-23 DIAGNOSIS — D0511 Intraductal carcinoma in situ of right breast: Secondary | ICD-10-CM | POA: Insufficient documentation

## 2016-06-29 NOTE — Progress Notes (Signed)
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  INTERVAL HISTORY: Patient returns to clinic today for further evaluation. She currently feels well and is asymptomatic. She has no neurologic complaints. She denies any recent fevers. She has a good appetite and denies weight loss. She denies any pain. She has no chest pain or shortness of breath. She denies any nausea, vomiting, constipation, or diarrhea. She has no urinary complaints. Patient feels at her baseline and offers no specific complaints today.  REVIEW OF SYSTEMS:   Review of Systems  Constitutional: Negative.  Negative for fever, malaise/fatigue and weight loss.  Respiratory: Negative.  Negative for sputum production.   Cardiovascular: Negative.  Negative for chest pain.  Gastrointestinal: Negative.  Negative for abdominal pain.  Genitourinary: Negative.   Musculoskeletal: Negative.   Neurological: Negative.  Negative for weakness.  Psychiatric/Behavioral: The patient is not nervous/anxious.     As per HPI. Otherwise, a complete review of systems is negative.  PAST MEDICAL HISTORY: Past Medical History:  Diagnosis Date  . Arthritis    BACK-LUMBAR  . Breast cancer ([place]) 10/14/2015   right DCIS  . GERD (gastroesophageal reflux disease)    OCC    PAST SURGICAL HISTORY: Past Surgical History:  Procedure Laterality Date  . BREAST BIOPSY Right 09/12/2015   stereo  . BREAST EXCISIONAL BIOPSY Right 10/13/2015   lumpectomy DCIS  . BREAST LUMPECTOMY Right 09/2015   with Radiation  . BREAST LUMPECTOMY WITH NEEDLE LOCALIZATION AND AXILLARY LYMPH NODE DISSECTION Right 10/13/2015   Procedure: BREAST LUMPECTOMY WITH NEEDLE LOCALIZATION AND AXILLARY LYMPH NODE DISSECTION;  Surgeon: Jules Husbands, MD;  Location:  [facility] ORS;  Service: General;  Laterality: Right;  . COLONOSCOPY  2009   no polyps  . SENTINEL NODE BIOPSY Right 10/13/2015   Procedure: SENTINEL NODE BIOPSY;  Surgeon: Jules Husbands, MD;  Location: [facility] ORS;  Service: General;  Laterality: Right;    FAMILY HISTORY Family History  Problem Relation Age of Onset  . Hypertension Mother   . Lung cancer Mother   . Arthritis Mother   . Hypertension Father   . COPD Father   . Liver cancer Sister   . Breast cancer Cousin        ADVANCED DIRECTIVES:    HEALTH MAINTENANCE: Social History  Substance Use Topics  . Smoking status: Never Smoker  . Smokeless tobacco: Never Used  . Alcohol use Yes     Comment: RARE     Colonoscopy:  PAP:  Bone density:  Lipid panel:  Allergies  Allergen Reactions  . Tramadol Hcl Other (See Comments)    Flu Like Symtom    Current Outpatient Prescriptions  Medication Sig Dispense Refill  . acetaminophen (TYLENOL) 325 MG tablet Take 650 mg by mouth every 6 (six) hours as needed for mild pain.     . Multiple Vitamin (MULTIVITAMIN) tablet Take 1 tablet by mouth daily.    . ranitidine (ZANTAC) 150 MG tablet Take 150 mg by mouth as needed for heartburn.     No current facility-administered medications for this visit.     OBJECTIVE: Vitals:   06/30/16 1051  BP: 113/84  Pulse: 99  Resp: 18  Temp: 99.3 F (37.4 C)     Body mass index is 22.99 kg/m.    ECOG FS:0 -  Asymptomatic  General: Well-developed, well-nourished, no acute distress. Eyes: Pink conjunctiva, anicteric sclera. Breasts: Patient declined breast exam given her recent normal mammogram. Lungs: Clear to auscultation bilaterally. Heart: Regular rate and rhythm. No rubs, murmurs, or gallops. Abdomen: Soft, nontender, nondistended. No organomegaly noted, normoactive bowel sounds. Musculoskeletal: No edema, cyanosis, or clubbing. Neuro: Alert, answering all questions appropriately. Cranial nerves grossly intact. Skin: No rashes or  petechiae noted. Psych: Normal affect.   LAB RESULTS:  Lab Results  Component Value Date   NA 135 11/03/2015   K 3.5 11/03/2015   CL 105 11/03/2015   CO2 22 11/03/2015   GLUCOSE 134 (H) 11/03/2015   BUN 13 11/03/2015   CREATININE 0.77 11/03/2015   CALCIUM 8.7 (L) 11/03/2015   PROT 7.0 04/27/2013   ALBUMIN 3.6 04/27/2013   AST 17 04/27/2013   ALT 17 04/27/2013   ALKPHOS 77 04/27/2013   BILITOT 0.2 04/27/2013   GFRNONAA >60 11/03/2015   GFRAA >60 11/03/2015    Lab Results  Component Value Date   WBC 5.5 12/16/2015   HGB 11.9 (L) 12/16/2015   HCT 35.0 12/16/2015   MCV 88.5 12/16/2015   PLT 200 12/16/2015     STUDIES: Mm Diag Breast Tomo Uni Right  Result Date: 06/23/2016 CLINICAL DATA:  61 year old female with history of right lumpectomy in June 2017 for ductal carcinoma in situ. The patient had negative margins. The sentinel lymph nodes were negative. EXAM: 2D DIGITAL DIAGNOSTIC UNILATERAL RIGHT MAMMOGRAM WITH CAD AND ADJUNCT TOMO COMPARISON:  Previous exam(s). ACR Breast Density Category c: The breast tissue is heterogeneously dense, which may obscure small masses. FINDINGS: New postlumpectomy changes are seen of the superior right breast. No suspicious mass, calcifications, or other abnormality is identified within the right breast. Mammographic images were processed with CAD. IMPRESSION: No mammographic evidence of malignancy. RECOMMENDATION: Bilateral diagnostic mammogram in 2 months to ensure timely screening of the patient's left breast. I have discussed the findings and recommendations with the patient. Results were also provided in writing at the conclusion of the visit. If applicable, a reminder letter will be sent to the patient regarding the next appointment. BI-RADS CATEGORY  2: Benign. Electronically Signed   By: Pamelia Hoit M.D.   On: 06/23/2016 11:58     ASSESSMENT: Right breast DCIS, ER/PR negative.  PLAN:    1. Right breast DCIS: Pathology from biopsy did  not have an invasive component and was also ER/PR negative which is unusual for DCIS. Patient lumpectomy on October 13, 2015. She completed adjuvant XRT. No further intervention is needed at this time. Tamoxifen will not benefit the patient given the ER/PR status of her DCIS. Patient's most recent mammogram on June 23, 2016 was reported as BI-RADS 2. Return to clinic in 6 months for further evaluation at which point we will likely switch to yearly visits alternating mammogram with evaluation and breast exam every 6 months.   Patient expressed understanding and was in agreement with this plan. She also understands that She can call clinic at any time with any questions, concerns, or complaints.     Lloyd Huger, MD   06/30/2016 10:53 AM

## 2016-06-30 ENCOUNTER — Inpatient Hospital Stay: Payer: Federal, State, Local not specified - PPO | Attending: Oncology | Admitting: Oncology

## 2016-06-30 VITALS — BP 113/84 | HR 99 | Temp 99.3°F | Resp 18 | Wt 129.8 lb

## 2016-06-30 DIAGNOSIS — Z86000 Personal history of in-situ neoplasm of breast: Secondary | ICD-10-CM | POA: Diagnosis present

## 2016-06-30 DIAGNOSIS — K219 Gastro-esophageal reflux disease without esophagitis: Secondary | ICD-10-CM | POA: Diagnosis not present

## 2016-06-30 DIAGNOSIS — Z803 Family history of malignant neoplasm of breast: Secondary | ICD-10-CM | POA: Diagnosis not present

## 2016-06-30 DIAGNOSIS — Z79899 Other long term (current) drug therapy: Secondary | ICD-10-CM | POA: Insufficient documentation

## 2016-06-30 DIAGNOSIS — Z801 Family history of malignant neoplasm of trachea, bronchus and lung: Secondary | ICD-10-CM | POA: Diagnosis not present

## 2016-06-30 DIAGNOSIS — Z808 Family history of malignant neoplasm of other organs or systems: Secondary | ICD-10-CM | POA: Diagnosis not present

## 2016-06-30 DIAGNOSIS — M129 Arthropathy, unspecified: Secondary | ICD-10-CM | POA: Diagnosis not present

## 2016-06-30 DIAGNOSIS — D0511 Intraductal carcinoma in situ of right breast: Secondary | ICD-10-CM

## 2016-06-30 NOTE — Progress Notes (Signed)
Offers no complaints. States is feeling well. 

## 2016-07-26 ENCOUNTER — Ambulatory Visit
Admission: RE | Admit: 2016-07-26 | Discharge: 2016-07-26 | Disposition: A | Discharge status: Home / Self Care | Payer: Federal, State, Local not specified - PPO | Source: Ambulatory Visit | Attending: Radiation Oncology | Admitting: Radiation Oncology

## 2016-07-26 ENCOUNTER — Encounter: Payer: Self-pay | Admitting: Radiation Oncology

## 2016-07-26 VITALS — BP 130/82 | HR 95 | Temp 98.3°F | Resp 20 | Wt 128.0 lb

## 2016-07-26 DIAGNOSIS — Z923 Personal history of irradiation: Secondary | ICD-10-CM | POA: Insufficient documentation

## 2016-07-26 DIAGNOSIS — Z86000 Personal history of in-situ neoplasm of breast: Secondary | ICD-10-CM | POA: Diagnosis present

## 2016-07-26 DIAGNOSIS — D0511 Intraductal carcinoma in situ of right breast: Secondary | ICD-10-CM

## 2016-07-26 NOTE — Progress Notes (Signed)
Radiation Oncology Follow up Note  Name: Teresa Silva   Date:   07/26/2016 MRN:  [id number] DOB: Aug 17, 1955    This 61 y.o. female presents to the clinic today for six-month follow-up status post whole breast radiation to her right breast for ductal carcinoma in situ ER/PR negative.  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 Seen today in routine follow-up she is doing well. She specifically denies breast tenderness cough or bone pain.. She had a mammogram last month showing BI-RADS 2 benign which I have reviewed. She is not on antiestrogen therapy based on the ER/PR negative nature of her DCIS  COMPLICATIONS OF TREATMENT: none  FOLLOW UP COMPLIANCE: keeps appointments   PHYSICAL EXAM:  BP 130/82   Pulse 95   Temp 98.3 F (36.8 C)   Resp 20   Wt 127 lb 15.6 oz (58 kg)   BMI 22.67 kg/m  Lungs are clear to A&P cardiac examination essentially unremarkable with regular rate and rhythm. No dominant mass or nodularity is noted in either breast in 2 positions examined. Incision is well-healed. No axillary or supraclavicular adenopathy is appreciated. Cosmetic result is excellent. Well-developed well-nourished patient in NAD. HEENT reveals PERLA, EOMI, discs not visualized.  Oral cavity is clear. No oral mucosal lesions are identified. Neck is clear without evidence of cervical or supraclavicular adenopathy. Lungs are clear to A&P. Cardiac examination is essentially unremarkable with regular rate and rhythm without murmur rub or thrill. Abdomen is benign with no organomegaly or masses noted. Motor sensory and DTR levels are equal and symmetric in the upper and lower extremities. Cranial nerves II through XII are grossly intact. Proprioception is intact. No peripheral adenopathy or edema is identified. No  motor or sensory levels are noted. Crude visual fields are within normal range.  RADIOLOGY RESULTS: Mammograms reviewed and compatible with the above-stated findings  PLAN: Present time patient 6 months out doing well with no evidence of disease. I am please were overall progress. She is not on antiestrogen therapy based on ER/PR negative nature of her disease. I've asked to see her back in 6 months for follow-up and then will start once your follow-up appointments. Patient knows to call sooner with any concerns.  I would like to take this opportunity to thank you for allowing me to participate in the care of your patient.Armstead Peaks., MD

## 2016-10-28 ENCOUNTER — Other Ambulatory Visit: Payer: Self-pay | Admitting: Orthopedic Surgery

## 2016-10-28 DIAGNOSIS — M25562 Pain in left knee: Principal | ICD-10-CM

## 2016-10-28 DIAGNOSIS — Z96652 Presence of left artificial knee joint: Principal | ICD-10-CM

## 2016-10-28 DIAGNOSIS — M2392 Unspecified internal derangement of left knee: Secondary | ICD-10-CM

## 2016-10-28 DIAGNOSIS — G8929 Other chronic pain: Secondary | ICD-10-CM

## 2016-11-04 ENCOUNTER — Ambulatory Visit
Admission: RE | Admit: 2016-11-04 | Discharge: 2016-11-04 | Disposition: A | Discharge status: Home / Self Care | Payer: Federal, State, Local not specified - PPO | Source: Ambulatory Visit | Attending: Orthopedic Surgery | Admitting: Orthopedic Surgery

## 2016-11-04 DIAGNOSIS — M25562 Pain in left knee: Secondary | ICD-10-CM

## 2016-11-04 DIAGNOSIS — M1712 Unilateral primary osteoarthritis, left knee: Secondary | ICD-10-CM | POA: Diagnosis not present

## 2016-11-04 DIAGNOSIS — Z96652 Presence of left artificial knee joint: Secondary | ICD-10-CM

## 2016-11-04 DIAGNOSIS — M23222 Derangement of posterior horn of medial meniscus due to old tear or injury, left knee: Secondary | ICD-10-CM | POA: Insufficient documentation

## 2016-11-04 DIAGNOSIS — G8929 Other chronic pain: Secondary | ICD-10-CM

## 2016-11-04 DIAGNOSIS — M2352 Chronic instability of knee, left knee: Secondary | ICD-10-CM | POA: Diagnosis present

## 2016-11-04 DIAGNOSIS — M2392 Unspecified internal derangement of left knee: Secondary | ICD-10-CM

## 2017-01-02 NOTE — Progress Notes (Deleted)
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  INTERVAL HISTORY: Patient returns to clinic today for further evaluation. She currently feels well and is asymptomatic. She has no neurologic complaints. She denies any recent fevers. She has a good appetite and denies weight loss. She denies any pain. She has no chest pain or shortness of breath. She denies any nausea, vomiting, constipation, or diarrhea. She has no urinary complaints. Patient feels at her baseline and offers no specific complaints today.  REVIEW OF SYSTEMS:   Review of Systems  Constitutional: Negative.  Negative for fever, malaise/fatigue and weight loss.  Respiratory: Negative.  Negative for sputum production.   Cardiovascular: Negative.  Negative for chest pain.  Gastrointestinal: Negative.  Negative for abdominal pain.  Genitourinary: Negative.   Musculoskeletal: Negative.   Neurological: Negative.  Negative for weakness.  Psychiatric/Behavioral: The patient is not nervous/anxious.     As per HPI. Otherwise, a complete review of systems is negative.  PAST MEDICAL HISTORY: Past Medical History:  Diagnosis Date  . Arthritis    BACK-LUMBAR-KNEES  . Breast cancer ([place]) 10/14/2015   right DCIS  . Complication of anesthesia   . GERD (gastroesophageal reflux disease)    OCC  . PONV (postoperative nausea and vomiting)   . Vertigo    IN PAST    PAST SURGICAL HISTORY: Past Surgical History:  Procedure Laterality Date  . BREAST BIOPSY Right 09/12/2015   stereo  . BREAST EXCISIONAL BIOPSY Right 10/13/2015   lumpectomy DCIS  . BREAST LUMPECTOMY Right 09/2015   with Radiation  . BREAST LUMPECTOMY WITH NEEDLE LOCALIZATION AND AXILLARY LYMPH NODE DISSECTION Right 10/13/2015   Procedure: BREAST  LUMPECTOMY WITH NEEDLE LOCALIZATION AND AXILLARY LYMPH NODE DISSECTION;  Surgeon: Jules Husbands, MD;  Location: [facility] ORS;  Service: General;  Laterality: Right;  . COLONOSCOPY  2009   no polyps  . SENTINEL NODE BIOPSY Right 10/13/2015   Procedure: SENTINEL NODE BIOPSY;  Surgeon: Jules Husbands, MD;  Location: [facility] ORS;  Service: General;  Laterality: Right;    FAMILY HISTORY Family History  Problem Relation Age of Onset  . Hypertension Mother   . Lung cancer Mother   . Arthritis Mother   . Hypertension Father   . COPD Father   . Liver cancer Sister   . Breast cancer Cousin        ADVANCED DIRECTIVES:    HEALTH MAINTENANCE: Social History  Substance Use Topics  . Smoking status: Never Smoker  . Smokeless tobacco: Never Used  . Alcohol use Yes     Comment: RARE     Colonoscopy:  PAP:  Bone density:  Lipid panel:  Allergies  Allergen Reactions  . Tramadol Hcl Other (See Comments)    Flu Like Symtom    Current Outpatient Prescriptions  Medication Sig Dispense Refill  . acetaminophen (TYLENOL) 325 MG tablet Take 650 mg by mouth every 6 (six) hours as needed for mild pain.     . Multiple Vitamin (MULTIVITAMIN) tablet Take 1 tablet by mouth daily.    . ranitidine (ZANTAC) 150 MG tablet Take 150 mg by mouth as needed for heartburn.     No current facility-administered medications for this visit.     OBJECTIVE: There were no vitals filed for this visit.   There is  no height or weight on file to calculate BMI.    ECOG FS:0 - Asymptomatic  General: Well-developed, well-nourished, no acute distress. Eyes: Pink conjunctiva, anicteric sclera. Breasts: Patient declined breast exam given her recent normal mammogram. Lungs: Clear to auscultation bilaterally. Heart: Regular rate and rhythm. No rubs, murmurs, or gallops. Abdomen: Soft, nontender, nondistended. No organomegaly noted, normoactive bowel sounds. Musculoskeletal: No edema, cyanosis, or clubbing. Neuro: Alert,  answering all questions appropriately. Cranial nerves grossly intact. Skin: No rashes or petechiae noted. Psych: Normal affect.   LAB RESULTS:  Lab Results  Component Value Date   NA 135 11/03/2015   K 3.5 11/03/2015   CL 105 11/03/2015   CO2 22 11/03/2015   GLUCOSE 134 (H) 11/03/2015   BUN 13 11/03/2015   CREATININE 0.77 11/03/2015   CALCIUM 8.7 (L) 11/03/2015   PROT 7.0 04/27/2013   ALBUMIN 3.6 04/27/2013   AST 17 04/27/2013   ALT 17 04/27/2013   ALKPHOS 77 04/27/2013   BILITOT 0.2 04/27/2013   GFRNONAA >60 11/03/2015   GFRAA >60 11/03/2015    Lab Results  Component Value Date   WBC 5.5 12/16/2015   HGB 11.9 (L) 12/16/2015   HCT 35.0 12/16/2015   MCV 88.5 12/16/2015   PLT 200 12/16/2015     STUDIES: No results found.   ASSESSMENT: Right breast DCIS, ER/PR negative.  PLAN:    1. Right breast DCIS: Pathology from biopsy did not have an invasive component and was also ER/PR negative which is unusual for DCIS. Patient lumpectomy on October 13, 2015. She completed adjuvant XRT. No further intervention is needed at this time. Tamoxifen will not benefit the patient given the ER/PR status of her DCIS. Patient's most recent mammogram on June 23, 2016 was reported as BI-RADS 2. Return to clinic in 6 months for further evaluation at which point we will likely switch to yearly visits alternating mammogram with evaluation and breast exam every 6 months.   Patient expressed understanding and was in agreement with this plan. She also understands that She can call clinic at any time with any questions, concerns, or complaints.     Lloyd Huger, MD   01/02/2017 9:38 AM

## 2017-01-05 ENCOUNTER — Ambulatory Visit: Payer: Federal, State, Local not specified - PPO | Attending: Radiation Oncology | Admitting: Radiation Oncology

## 2017-01-05 ENCOUNTER — Inpatient Hospital Stay: Payer: Federal, State, Local not specified - PPO | Admitting: Oncology

## 2017-01-07 ENCOUNTER — Encounter
Admission: RE | Disposition: A | Discharge status: Home / Self Care | Payer: Self-pay | Source: Ambulatory Visit | Attending: Unknown Physician Specialty

## 2017-01-07 ENCOUNTER — Ambulatory Visit: Payer: Federal, State, Local not specified - PPO | Admitting: Anesthesiology

## 2017-01-07 ENCOUNTER — Ambulatory Visit
Admission: RE | Admit: 2017-01-07 | Discharge: 2017-01-07 | Disposition: A | Discharge status: Home / Self Care | Payer: Federal, State, Local not specified - PPO | Source: Ambulatory Visit | Attending: Unknown Physician Specialty | Admitting: Unknown Physician Specialty

## 2017-01-07 DIAGNOSIS — X58XXXA Exposure to other specified factors, initial encounter: Secondary | ICD-10-CM | POA: Insufficient documentation

## 2017-01-07 DIAGNOSIS — Z79899 Other long term (current) drug therapy: Secondary | ICD-10-CM | POA: Insufficient documentation

## 2017-01-07 DIAGNOSIS — S83242A Other tear of medial meniscus, current injury, left knee, initial encounter: Secondary | ICD-10-CM | POA: Diagnosis not present

## 2017-01-07 DIAGNOSIS — Z853 Personal history of malignant neoplasm of breast: Secondary | ICD-10-CM | POA: Diagnosis not present

## 2017-01-07 DIAGNOSIS — M2392 Unspecified internal derangement of left knee: Secondary | ICD-10-CM | POA: Diagnosis not present

## 2017-01-07 DIAGNOSIS — K219 Gastro-esophageal reflux disease without esophagitis: Secondary | ICD-10-CM | POA: Insufficient documentation

## 2017-01-07 HISTORY — DX: Adverse effect of unspecified anesthetic, initial encounter: T41.45XA

## 2017-01-07 HISTORY — DX: Other complications of anesthesia, initial encounter: T88.59XA

## 2017-01-07 HISTORY — DX: Other specified postprocedural states: Z98.890

## 2017-01-07 HISTORY — DX: Dizziness and giddiness: R42

## 2017-01-07 HISTORY — DX: Nausea with vomiting, unspecified: R11.2

## 2017-01-07 HISTORY — PX: KNEE ARTHROSCOPY: SHX127

## 2017-01-07 SURGERY — ARTHROSCOPY, KNEE
Anesthesia: General | Laterality: Left

## 2017-01-07 MED ORDER — ONDANSETRON HCL 4 MG/2ML IJ SOLN
INTRAMUSCULAR | Status: DC | PRN
  Administered 2017-01-07: 4 mg via INTRAVENOUS

## 2017-01-07 MED ORDER — FENTANYL CITRATE (PF) 100 MCG/2ML IJ SOLN
25.0000 ug | INTRAMUSCULAR | Status: DC | PRN
  Administered 2017-01-07: 50 ug via INTRAVENOUS
  Administered 2017-01-07: 25 ug via INTRAVENOUS

## 2017-01-07 MED ORDER — PROPOFOL 10 MG/ML IV BOLUS
INTRAVENOUS | Status: DC | PRN
  Administered 2017-01-07: 150 mg via INTRAVENOUS

## 2017-01-07 MED ORDER — LACTATED RINGERS IV SOLN
10.0000 mL/h | INTRAVENOUS | Status: DC
  Administered 2017-01-07: 10 mL/h via INTRAVENOUS

## 2017-01-07 MED ORDER — HYDROCODONE-ACETAMINOPHEN 5-325 MG PO TABS: 1.0000 | ORAL_TABLET | Freq: Four times a day (QID) | ORAL | 0 refills | Status: DC | PRN

## 2017-01-07 MED ORDER — PROMETHAZINE HCL 25 MG/ML IJ SOLN: 6.2500 mg | INTRAMUSCULAR | Status: DC | PRN

## 2017-01-07 MED ORDER — LIDOCAINE HCL (CARDIAC) 20 MG/ML IV SOLN
INTRAVENOUS | Status: DC | PRN
  Administered 2017-01-07: 50 mg via INTRATRACHEAL

## 2017-01-07 MED ORDER — SCOPOLAMINE 1 MG/3DAYS TD PT72
1.0000 | MEDICATED_PATCH | TRANSDERMAL | Status: DC
  Administered 2017-01-07: 1.5 mg via TRANSDERMAL

## 2017-01-07 MED ORDER — BUPIVACAINE HCL (PF) 0.5 % IJ SOLN
INTRAMUSCULAR | Status: DC | PRN
  Administered 2017-01-07: 10 mL

## 2017-01-07 MED ORDER — OXYCODONE HCL 5 MG PO TABS
5.0000 mg | ORAL_TABLET | Freq: Once | ORAL | Status: AC | PRN
  Administered 2017-01-07: 5 mg via ORAL

## 2017-01-07 MED ORDER — EPHEDRINE SULFATE 50 MG/ML IJ SOLN
INTRAMUSCULAR | Status: DC | PRN
  Administered 2017-01-07 (×4): 5 mg via INTRAVENOUS

## 2017-01-07 MED ORDER — FENTANYL CITRATE (PF) 100 MCG/2ML IJ SOLN
INTRAMUSCULAR | Status: DC | PRN
  Administered 2017-01-07 (×2): 50 ug via INTRAVENOUS

## 2017-01-07 MED ORDER — DEXAMETHASONE SODIUM PHOSPHATE 4 MG/ML IJ SOLN
INTRAMUSCULAR | Status: DC | PRN
  Administered 2017-01-07: 8 mg via INTRAVENOUS

## 2017-01-07 MED ORDER — MIDAZOLAM HCL 5 MG/5ML IJ SOLN
INTRAMUSCULAR | Status: DC | PRN
  Administered 2017-01-07: 2 mg via INTRAVENOUS

## 2017-01-07 MED ORDER — MEPERIDINE HCL 25 MG/ML IJ SOLN: 6.2500 mg | INTRAMUSCULAR | Status: DC | PRN

## 2017-01-07 MED ORDER — OXYCODONE HCL 5 MG/5ML PO SOLN: 5.0000 mg | Freq: Once | ORAL | Status: AC | PRN

## 2017-01-07 SURGICAL SUPPLY — 31 items
ARTHROWAND PARAGON T2 (SURGICAL WAND) ×3
BLADE FULL RADIUS 3.5 (BLADE) ×3 IMPLANT
BNDG ESMARK 6X12 TAN STRL LF (GAUZE/BANDAGES/DRESSINGS) IMPLANT
BUR ACROMIONIZER 4.0 (BURR) IMPLANT
CANN 8.5MMX72MM THREADED (CANNULA)
CANNULA THRD 8.5X72 DISP (CANNULA) IMPLANT
COVER LIGHT HANDLE UNIVERSAL (MISCELLANEOUS) ×6 IMPLANT
CUTTER SLOTTED WHISKER 4.0 (BURR) ×3 IMPLANT
DRAPE LEGGINS SURG 28X43 STRL (DRAPES) ×3 IMPLANT
DURAPREP 26ML APPLICATOR (WOUND CARE) ×3 IMPLANT
GAUZE SPONGE 4X4 12PLY STRL (GAUZE/BANDAGES/DRESSINGS) ×3 IMPLANT
GLOVE BIO SURGEON STRL SZ7.5 (GLOVE) ×3 IMPLANT
GLOVE BIO SURGEON STRL SZ8 (GLOVE) ×3 IMPLANT
GLOVE INDICATOR 8.0 STRL GRN (GLOVE) ×3 IMPLANT
GOWN STRL REIN 2XL XLG LVL4 (GOWN DISPOSABLE) ×6 IMPLANT
IV LACTATED RINGER IRRG 3000ML (IV SOLUTION) ×4
IV LR IRRIG 3000ML ARTHROMATIC (IV SOLUTION) ×2 IMPLANT
KIT ROOM TURNOVER OR (KITS) ×3 IMPLANT
MANIFOLD 4PT FOR NEPTUNE1 (MISCELLANEOUS) ×3 IMPLANT
PACK ARTHROSCOPY KNEE (MISCELLANEOUS) ×3 IMPLANT
SET TUBE SUCT SHAVER OUTFL 24K (TUBING) ×3 IMPLANT
SOL PREP PVP 2OZ (MISCELLANEOUS) ×3
SOLUTION PREP PVP 2OZ (MISCELLANEOUS) ×1 IMPLANT
SUT ETHILON 3-0 FS-10 30 BLK (SUTURE) ×3
SUTURE EHLN 3-0 FS-10 30 BLK (SUTURE) ×1 IMPLANT
TAPE MICROFOAM 4IN (TAPE) ×3 IMPLANT
TUBING ARTHRO INFLOW-ONLY STRL (TUBING) ×3 IMPLANT
WAND ARTHRO PARAGON T2 (SURGICAL WAND) ×1 IMPLANT
WAND HAND CNTRL MULTIVAC 50 (MISCELLANEOUS) IMPLANT
WAND HAND CNTRL MULTIVAC 90 (MISCELLANEOUS) IMPLANT
WRAP KNEE W/COLD PACKS 25.5X14 (SOFTGOODS) ×3 IMPLANT

## 2017-01-07 NOTE — Anesthesia Preprocedure Evaluation (Signed)
Anesthesia Evaluation  Patient identified by MRN, date of birth, ID band Patient awake    History of Anesthesia Complications (+) PONV  Airway Mallampati: I  TM Distance: >3 FB Neck ROM: Full    Dental   Pulmonary    Pulmonary exam normal        Cardiovascular negative cardio ROS Normal cardiovascular exam     Neuro/Psych    GI/Hepatic GERD  Medicated and Controlled,  Endo/Other    Renal/GU      Musculoskeletal   Abdominal   Peds  Hematology   Anesthesia Other Findings History of breast cancer  Reproductive/Obstetrics                             Anesthesia Physical Anesthesia Plan  ASA: II  Anesthesia Plan: General   Post-op Pain Management:  Regional for Post-op pain   Induction: Intravenous  PONV Risk Score and Plan:   Airway Management Planned: LMA  Additional Equipment:   Intra-op Plan:   Post-operative Plan:   Informed Consent: I have reviewed the patients History and Physical, chart, labs and discussed the procedure including the risks, benefits and alternatives for the proposed anesthesia with the patient or authorized representative who has indicated his/her understanding and acceptance.     Plan Discussed with: CRNA  Anesthesia Plan Comments:         Anesthesia Quick Evaluation

## 2017-01-07 NOTE — Transfer of Care (Signed)
Immediate Anesthesia Transfer of Care Note  Patient: Teresa Silva  Procedure(s) Performed: ARTHROSCOPY KNEE (Left )  Patient Location: PACU  Anesthesia Type: General  Level of Consciousness: awake, alert  and patient cooperative  Airway and Oxygen Therapy: Patient Spontanous Breathing and Patient connected to supplemental oxygen  Post-op Assessment: Post-op Vital signs reviewed, Patient's Cardiovascular Status Stable, Respiratory Function Stable, Patent Airway and No signs of Nausea or vomiting  Post-op Vital Signs: Reviewed and stable  Complications: No apparent anesthesia complications

## 2017-01-07 NOTE — H&P (Signed)
  H and P reviewed. No changes. Uploaded at later date. 

## 2017-01-07 NOTE — Anesthesia Postprocedure Evaluation (Signed)
Anesthesia Post Note  Patient: Teresa Silva  Procedure(s) Performed: ARTHROSCOPY KNEE (Left )  Patient location during evaluation: PACU Anesthesia Type: General Level of consciousness: awake and alert Pain management: pain level controlled Vital Signs Assessment: post-procedure vital signs reviewed and stable Respiratory status: spontaneous breathing, nonlabored ventilation, respiratory function stable and patient connected to nasal cannula oxygen Cardiovascular status: blood pressure returned to baseline and stable Postop Assessment: no apparent nausea or vomiting Anesthetic complications: no    Marshell Levan

## 2017-01-07 NOTE — Op Note (Signed)
Patient: Teresa Silva, Teresa Silva  Preoperative diagnosis: Torn medial meniscus left knee plus medial compartment chondral pathology  Postop diagnosis: Same  Operation: Arthroscopic partial medial meniscectomy left knee plus debridement and Coblation of medial femoral chondral lesion  Surgeon: Vilinda Flake, MD  Anesthesia: Gen.   History: Patient's had a long history of left knee pain.  The plain films revealed no significant joint space narrowing .  The patient had an MRI which revealed a degenerative tear of the posterior horn of the medial meniscus plus a grade 2-3 medial femoral chondral lesion.The patient was scheduled for surgery due to persistent discomfort despite conservative treatment.  The patient was taken the operating room where satisfactory general anesthesia was achieved. A tourniquet and leg holder were was applied to the left thigh. A well leg support was applied to the nonoperative extremity. The left knee was prepped and draped in usual fashion for an arthroscopic procedure. An inflow cannula was introduced superomedially. The joint was distended with lactated Ringer's. Scope was introduced through an inferolateral puncture wound and a probe through an inferomedial puncture wound. Inspection of the medial compartment revealed  a degenerative tear of the posterior horn of the medial meniscus plus a moderate sized grade 3 chondral lesion in the mid weightbearing portion of the medial femoral condyle. I went ahead and resected the tear using a combination of basket biters and a motorized resector. I debrided the medial femoral chondral lesion with a turbo whisker and then coblated the lesion with an [employer] Paragon wand. Inspection of the intercondylar notch revealed intact cruciates. Inspection of the the lateral compartment revealed no meniscal or chondral pathology.   Trochlear groove was inspected and appeared to be fairly smooth.  Inspection of the retropatellar surface  revealed no significant retropatellar fibrillation. I observed patella tracking from the inferolateral portal. The patella seemed to track fairly well.  The instruments were removed from the joint at this time. The puncture wounds were closed with 3-0 nylon in vertical mattress fashion. I injected each puncture wound with several cc of half percent Marcaine without epinephrine. Betadine was applied to the wounds followed by sterile dressing. An ice pack was applied to the left knee. The patient was awakened and transferred to the stretcher bed. The patient was taken to the recovery room in satisfactory condition.  The tourniquet was not inflated during the course of the procedure. Blood loss was negligible.

## 2017-01-07 NOTE — Anesthesia Procedure Notes (Signed)
Procedure Name: LMA Insertion Date/Time: 01/07/2017 8:44 AM Performed by: Londell Moh Pre-anesthesia Checklist: Patient identified, Emergency Drugs available, Suction available, Timeout performed and Patient being monitored Patient Re-evaluated:Patient Re-evaluated prior to induction Oxygen Delivery Method: Circle system utilized Preoxygenation: Pre-oxygenation with 100% oxygen Induction Type: IV induction LMA: LMA inserted LMA Size: 4.0 Number of attempts: 1 Placement Confirmation: positive ETCO2 and breath sounds checked- equal and bilateral Tube secured with: Tape

## 2017-01-07 NOTE — Discharge Instructions (Signed)
[facility]  Kathrene Alu., M.D. [phone]   KNEE ARTHROSCOPY POST OPERATION INSTRUCTIONS:  PLEASE READ THESE INSTRUCTIONS ABOUT POST OPERATION CARE. THEY WILL ANSWER MOST OF YOUR QUESTIONS.  You have been given a prescription for pain. Please take as directed for pain.  You can walk, keeping the knee slightly stiff-avoid doing too much bending the first day. (if ACL reconstruction is performed, keep brace locked in extension when walking.)  You will use crutches or cane if needed. Can weight bear as tolerated  Plan to take three to four days off from work. You can resume work when you are comfortable. (This can be a week or more, depending on the type of work you do.)  To reduce pain and swelling, place one to two pillows under the knee the first two or three days when sitting or lying. An ice pack may be placed on top of the area over the dressing. Instructions for making homemade icepack are as follow:  Flexible homemade alcohol water ice pack  2 cups water  1 cup rubbing alcohol  food coloring for the blue tint (optional)  2 zip-top bags - gallon-size  Mix the water and alcohol together in one of your zip-top bags and add food coloring. Release as much air as possible and seal the bag. Place in freezer for at least 12 hours.  The small incisions in your knee are closed with nylon stitches. They will be removed in the office.  The bulky dressing may be removed in the third day after surgery. (If ACL surgery-DO NOT REMOVE BANDAGES). Put a waterproof band-aid over each stitch. Do not put any creams or ointments on wounds. You may shower at this time, but change waterproof band-aids after showering. KEEP INCISIONS CLEAN AND DRY UNTIL YOU RETURN TO THE OFFICE.  Sometimes the operative area remains somewhat painful and swollen for several weeks. This is usually nothing to worry about, but call if you have any excessive symptoms, especially  fever. It is not unusual to have a low grade fever of 99 degrees for the first few days. If persist after 3-4 days call the office. It is not uncommon for the pain to be a little worse on the third day after surgery.  Begin doing gentle exercises right away. They will be limited by the amount of pain and swelling you have.  Exercising will reduce the swelling, increase motion, and prevent muscle weakness. Exercises: Straight leg raising and gentle knee bending.  Take a 325 milligram aspirin or Bufferin tablet twice a day for 2 weeks after meals or milk. This along with elevation will help reduce the possibility of phlebitis in your operated leg.  Avoid strenuous athletics for a minimum of 4 to 6 weeks after arthroscopic surgery (approximately five months if ACL surgery).  If the surgery included ACL reconstruction the brace that is supplied to the extremity post surgery is to be locked in extension when you are asleep and is to be locked in extension when you are ambulating. It can be unlocked for exercises or sitting.  Keep your post surgery appointment that has been made for you. If you do not remember the date call [phone]. Your follow up appointment should be between 7-10 days.  Scopolamine skin patches REMOVE PATCH IN 72 HOURS AND [place] HANDS IMMEDIATELY  What is this medicine? SCOPOLAMINE (skoe POL a meen) is used to prevent nausea and vomiting caused by motion sickness, anesthesia and surgery. This  medicine may be used for other purposes; ask your health care provider or pharmacist if you have questions. COMMON BRAND NAME(S): Transderm Scop What should I tell my health care provider before I take this medicine? They need to know if you have any of these conditions: -glaucoma -kidney or liver disease -an unusual or allergic reaction (especially skin allergy) to scopolamine, atropine, other medicines, foods, dyes, or preservatives -pregnant or trying to get pregnant -breast-feeding How  should I use this medicine? This medicine is for external use only. Follow the directions on the prescription label. One patch contains enough medicine to prevent motion sickness for up to 3 days. Apply the patch at least 4 hours before you need it and only wear one disc at a time. Choose an area behind the ear, that is clean, dry, hairless and free from any cuts or irritation. Wipe the area with a clean dry tissue. Peel off the plastic backing of the skin patch, trying not to touch the adhesive side with your hands. Do not cut the patches. Firmly apply to the area you have chosen, with the metallic side of the patch to the skin and the tan-colored side showing. Once firmly in place, wash your hands well with soap and water. Remove the disc after 3 days, or sooner if you no longer need it. After removing the patch, wash your hands and the area behind your ear thoroughly with soap and water. The patch will still contain some medicine after use. To avoid accidental contact or ingestion by children or pets, fold the used patch in half with the sticky side together and throw away in the trash out of the reach of children and pets. If you need to use a second patch after you remove the first, place it behind the other ear. Talk to your pediatrician regarding the use of this medicine in children. Special care may be needed. Overdosage: If you think you have taken too much of this medicine contact a poison control center or emergency room at once. NOTE: This medicine is only for you. Do not share this medicine with others. What if I miss a dose? Make sure you apply the patch at least 4 hours before you need it. You can apply it the night before traveling. What may interact with this medicine? -benztropine -bethanechol -medicines for anxiety or sleeping problems like diazepam or temazepam -medicines for hay fever and other allergies -medicines for mental depression -muscle relaxants This list may not describe  all possible interactions. Give your health care provider a list of all the medicines, herbs, non-prescription drugs, or dietary supplements you use. Also tell them if you smoke, drink alcohol, or use illegal drugs. Some items may interact with your medicine. What should I watch for while using this medicine? Keep the patch dry, if possible, to prevent it from falling off. Limited contact with water, however, as in bathing or swimming, will not affect the system. If the patch falls off, throw it away and put a new one behind the other ear. You may get drowsy or dizzy. Do not drive, use machinery, or do anything that needs mental alertness until you know how this medicine affects you. Do not stand or sit up quickly, especially if you are an older patient. This reduces the risk of dizzy or fainting spells. Alcohol may interfere with the effect of this medicine. Avoid alcoholic drinks. Your mouth may get dry. Chewing sugarless gum or sucking hard candy, and drinking plenty of water  may help. Contact your doctor if the problem does not go away or is severe. This medicine may cause dry eyes and blurred vision. If you wear contact lenses you may feel some discomfort. Lubricating drops may help. See your eye doctor if the problem does not go away or is severe. If you are going to have a magnetic resonance imaging (MRI) procedure, tell your MRI technician if you have this patch on your body. It must be removed before a MRI. What side effects may I notice from receiving this medicine? Side effects that you should report to your doctor or health care professional as soon as possible: -agitation, nervousness, confusion -blurred vision and other eye problems -dizziness, drowsiness -eye pain or redness in the whites of the eye -hallucinations -pain or difficulty passing urine -skin rash, itching -vomiting Side effects that usually do not require medical attention (report to your doctor or health care  professional if they continue or are bothersome): -headache -nausea This list may not describe all possible side effects. Call your doctor for medical advice about side effects. You may report side effects to FDA at 1-800-FDA-1088. Where should I keep my medicine? Keep out of the reach of children. Store at room temperature between 20 and 25 degrees C (68 and 77 degrees F). Throw away any unused medicine after the expiration date. When you remove a patch, fold it and throw it in the trash as described above. NOTE: This sheet is a summary. It may not cover all possible information. If you have questions about this medicine, talk to your doctor, pharmacist, or health care provider.  2018 Elsevier/Gold Standard (2011-08-12 13:31:48)  General Anesthesia, Adult, Care After These instructions provide you with information about caring for yourself after your procedure. Your health care provider may also give you more specific instructions. Your treatment has been planned according to current medical practices, but problems sometimes occur. Call your health care provider if you have any problems or questions after your procedure. What can I expect after the procedure? After the procedure, it is common to have:  Vomiting.  A sore throat.  Mental slowness.  It is common to feel:  Nauseous.  Cold or shivery.  Sleepy.  Tired.  Sore or achy, even in parts of your body where you did not have surgery.  Follow these instructions at home: For at least 24 hours after the procedure:  Do not: ? Participate in activities where you could fall or become injured. ? Drive. ? Use heavy machinery. ? Drink alcohol. ? Take sleeping pills or medicines that cause drowsiness. ? Make important decisions or sign legal documents. ? Take care of children on your own.  Rest. Eating and drinking  If you vomit, drink water, juice, or soup when you can drink without vomiting.  Drink enough fluid to keep  your urine clear or pale yellow.  Make sure you have little or no nausea before eating solid foods.  Follow the diet recommended by your health care provider. General instructions  Have a responsible adult stay with you until you are awake and alert.  Return to your normal activities as told by your health care provider. Ask your health care provider what activities are safe for you.  Take over-the-counter and prescription medicines only as told by your health care provider.  If you smoke, do not smoke without supervision.  Keep all follow-up visits as told by your health care provider. This is important. Contact a health care provider if:  continue to have nausea or vomiting at home, and medicines are not helpful. °· You cannot drink fluids or start eating again. °· You cannot urinate after 8-12 hours. °· You develop a skin rash. °· You have fever. °· You have increasing redness at the site of your procedure. °Get help right away if: °· You have difficulty breathing. °· You have chest pain. °· You have unexpected bleeding. °· You feel that you are having a life-threatening or urgent problem. °This information is not intended to replace advice given to you by your health care provider. Make sure you discuss any questions you have with your health care provider. °Document Released: 06/21/2000 Document Revised: 08/18/2015 Document Reviewed: 02/27/2015 °Elsevier Interactive Patient Education © 2018 Elsevier Inc. ° °

## 2017-01-10 ENCOUNTER — Encounter: Payer: Self-pay | Admitting: Unknown Physician Specialty

## 2017-02-22 DIAGNOSIS — M1712 Unilateral primary osteoarthritis, left knee: Secondary | ICD-10-CM | POA: Insufficient documentation

## 2017-06-08 ENCOUNTER — Other Ambulatory Visit: Payer: Self-pay | Admitting: Family Medicine

## 2017-06-08 DIAGNOSIS — Z853 Personal history of malignant neoplasm of breast: Secondary | ICD-10-CM

## 2017-07-11 ENCOUNTER — Ambulatory Visit
Admission: RE | Admit: 2017-07-11 | Discharge: 2017-07-11 | Disposition: A | Discharge status: Home / Self Care | Payer: BLUE CROSS/BLUE SHIELD | Source: Ambulatory Visit | Attending: Family Medicine | Admitting: Family Medicine

## 2017-07-11 DIAGNOSIS — Z853 Personal history of malignant neoplasm of breast: Secondary | ICD-10-CM

## 2017-07-11 HISTORY — DX: Personal history of irradiation: Z92.3

## 2017-10-11 DIAGNOSIS — M79641 Pain in right hand: Secondary | ICD-10-CM | POA: Insufficient documentation

## 2017-10-11 DIAGNOSIS — M79642 Pain in left hand: Secondary | ICD-10-CM | POA: Insufficient documentation

## 2017-11-10 ENCOUNTER — Ambulatory Visit: Payer: BLUE CROSS/BLUE SHIELD | Attending: Rheumatology | Admitting: Occupational Therapy

## 2017-11-10 ENCOUNTER — Encounter: Payer: Self-pay | Admitting: Occupational Therapy

## 2017-11-10 ENCOUNTER — Other Ambulatory Visit: Payer: Self-pay

## 2017-11-10 DIAGNOSIS — M25641 Stiffness of right hand, not elsewhere classified: Secondary | ICD-10-CM | POA: Diagnosis present

## 2017-11-10 DIAGNOSIS — M79642 Pain in left hand: Secondary | ICD-10-CM | POA: Diagnosis present

## 2017-11-10 DIAGNOSIS — M79641 Pain in right hand: Secondary | ICD-10-CM | POA: Insufficient documentation

## 2017-11-10 DIAGNOSIS — M6281 Muscle weakness (generalized): Secondary | ICD-10-CM | POA: Diagnosis present

## 2017-11-10 NOTE — Therapy (Signed)
[facility] [address], [place], [address] Phone: [phone]   Fax:  [phone]  Occupational Therapy Evaluation  Patient Details  Name: Teresa Silva MRN: [id number] Date of Birth: September 16, 1960 Referring Provider: Jefm Bryant    Encounter Date: 11/10/2017  OT End of Session - 11/10/17 1750    Visit Number  1    Number of Visits  8    Date for OT Re-Evaluation  12/08/17    OT Start Time  1525    OT Stop Time  1624    OT Time Calculation (min)  59 min    Activity Tolerance  Patient tolerated treatment well;Patient limited by pain    Behavior During Therapy  [facility] for tasks assessed/performed       Past Medical History:  Diagnosis Date  . Arthritis    BACK-LUMBAR-KNEES  . 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 Complication of anesthesia   . GERD (gastroesophageal reflux disease)    OCC  . Personal history of radiation therapy    f/u rt breast cancer  . PONV (postoperative nausea and vomiting)   . Vertigo    IN PAST    Past Surgical History:  Procedure Laterality Date  . BREAST BIOPSY Right 09/12/2015   stereo  . BREAST EXCISIONAL BIOPSY Right 10/13/2015   lumpectomy DCIS  . BREAST LUMPECTOMY Right 09/2015   with Radiation  . BREAST LUMPECTOMY WITH NEEDLE LOCALIZATION AND AXILLARY LYMPH NODE DISSECTION Right 10/13/2015   Procedure: BREAST LUMPECTOMY WITH NEEDLE LOCALIZATION AND AXILLARY LYMPH NODE DISSECTION;  Surgeon: Jules Husbands, MD;  Location: [facility];  Service: General;  Laterality: Right;  . COLONOSCOPY  2009   no polyps  . KNEE ARTHROSCOPY Left 01/07/2017   Procedure: ARTHROSCOPY KNEE;  Surgeon: Leanor Kail, MD;  Location: [facility];  Service: Orthopedics;  Laterality: Left;  . SENTINEL NODE BIOPSY Right 10/13/2015   Procedure: SENTINEL NODE BIOPSY;  Surgeon: Jules Husbands, MD;  Location: [facility];  Service: General;  Laterality: Right;    There were no vitals  filed for this visit.  Subjective Assessment - 11/10/17 1743    Subjective   Has arthritis and my hand pain gradually increase over the last about 6 months and thumbs worse - R one - did start with my middle finger - pain can be 10/.10 as the day goes -  drop things, cannot carry , grip objects , cut food or open jars     Patient Stated Goals  I want my pain better, thumb locking better- to be able to use my thumbs and hands with out pain - to grip , pull , lift objects, open jars , cut with food     Currently in Pain?  Yes    Pain Score  8     Pain Location  Hand    Pain Orientation  Right    Pain Descriptors / Indicators  Aching;Stabbing;Throbbing    Pain Type  Acute pain    Pain Onset  More than a month ago    Pain Frequency  Constant        [facility] OT Assessment - 11/10/17 0001      Assessment   Medical Diagnosis  Bilateral hand pain - OA    Referring Provider  Jefm Bryant     Onset Date/Surgical Date  03/29/17    Hand Dominance  Right    Next MD Visit  --  29th Aug      Home  Environment   Lives With  Family      Prior Function   Vocation  Retired    Leisure  Take care of 62 yrs old grandson , some yard work , house work ,       Nurse, 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    Right Hand Lateral Pinch  10 lbs    Right Hand 3 Point Pinch  8 lbs    Left Hand Grip (lbs)  40    Left Hand Lateral Pinch  12 lbs    Left Hand 3 Point Pinch  12 lbs      Right Hand AROM   R Thumb Radial ABduction/ADduction 0-55  45    R Thumb Opposition to Index  --   triggering    R Index  MCP 0-90  75 Degrees    R Long  MCP 0-90  80 Degrees    R Ring  MCP 0-90  85 Degrees    R Little  MCP 0-90  90 Degrees          Review HEP with pt and hand out provided     Contrast   2-3 x week  AROM tendon glides  Thumb PA and RA 10reps  pain free  use bandaid for IP to prevent flexion or triggering - with use and during HEP   Joint protectoin review with pt  And hand out provided  Several times  during day ice massage over Thumb volar MP     Ionto with dexamethazone - small patch on both Thumb volar MP - at 2.0 current 19 min  skin check done prior and after wards - no issues      OT Education - 11/10/17 1749    Education Details  findings and HEP - treatment plan     Person(s) Educated  Patient    Methods  Explanation;Demonstration    Comprehension  Returned demonstration       OT Short Term Goals - 11/10/17 1758      OT SHORT TERM GOAL #1   Title  Pain decrease on PRWHE by at least 20 points     Baseline  pain at eval on PRWHE 41/50     Time  3    Period  Weeks    Status  New    Target Date  12/01/17      OT SHORT TERM GOAL #2   Title  Pt to show independence in HEP to  decrease pain and triggering of R hand to less than 2 x day     Baseline  thumb triggering constantly during day and pain increase to 6210 at thumb on R and L 8     Time  3    Period  Weeks    Status  New    Target Date  12/01/17        OT Long Term Goals - 11/10/17 1800      OT LONG TERM GOAL #1   Title  Pt to demo and verbalize 3 joint protection and modifications to decrease triggering and pain in bilateral hands     Baseline  no knowledge - grip and use hand - over grip , carrying objects with lat grip and tight grip     Time  4    Period  Weeks    Status  New    Target Date  12/08/17  OT LONG TERM GOAL #2   Title  Function score on PRWHE improve with more than 20 points     Baseline  Function score on PRWHE at eval 27.5/50    Time  4    Period  Weeks    Status  New    Target Date  12/08/17            Plan - 11/10/17 1751    Clinical Impression Statement  Pt present at OT eval with diagnosis of OA - tenderness over bilateral thumb A1pulleys R worse than L -and 4th on R hand - pain can increase to 10/10 on R thumb - pt show also pain in all digits with increase use - and in thumb - decrease thumb PA and RA and R hand MC's flexion - pt  increase pain , decrease AROM   and strength R worse than L - limiiting her functional use of hands in ADL 's and IADl's - pt can benefit from OT services     Occupational performance deficits (Please refer to evaluation for details):  ADL's;IADL's;Play;Leisure;Rest and Sleep    Rehab Potential  Good    Current Impairments/barriers affecting progress:  OA, had pain and tenderness - triggering for more than 6 monts     OT Frequency  2x / week    OT Duration  4 weeks    OT Treatment/Interventions  Iontophoresis;Self-care/ADL training;Patient/family education;Splinting;Fluidtherapy;Contrast Bath;Manual Therapy;Passive range of motion;Therapeutic exercise    Plan  assess progress with HEP     Clinical Decision Making  Several treatment options, min-mod task modification necessary    OT Home Exercise Plan  see pt instruction     Consulted and Agree with Plan of Care  Patient       Patient will benefit from skilled therapeutic intervention in order to improve the following deficits and impairments:  Pain, Impaired flexibility, Decreased strength, Impaired UE functional use, Decreased knowledge of precautions, Decreased range of motion  Visit Diagnosis: Pain in right hand - Plan: Ot plan of care cert/re-cert  Pain in left hand - Plan: Ot plan of care cert/re-cert  Stiffness of right hand, not elsewhere classified - Plan: Ot plan of care cert/re-cert  Muscle weakness (generalized) - Plan: Ot plan of care cert/re-cert    Problem List Patient Active Problem List   Diagnosis Date Noted  . 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 Abdominal pain, epigastric 02/13/2014  . Degeneration of intervertebral disc of cervical region 02/07/2014  . Muscle ache 02/07/2014  . Back ache 02/07/2013  . Lumbar radiculopathy 02/07/2013    Rosalyn Gess OTR/L,CLT 11/10/2017, 6:06 PM  [facility] [address] [place], [address] Phone:  [phone]   Fax:  [phone]  Name: NATALIYAH PACKHAM MRN: [id number] Date of Birth: [birth year]

## 2017-11-10 NOTE — Patient Instructions (Addendum)
Contrast   2-3 x week  AROM tendon glides  Thumb PA and RA 10reps  pain free  use bandaid for IP to prevent flexion or triggering - with use and during HEP   Joint protectoin review with pt  And hand out provided  Several times during day ice massage over Thumb volar MP

## 2017-11-15 ENCOUNTER — Ambulatory Visit: Payer: BLUE CROSS/BLUE SHIELD | Admitting: Occupational Therapy

## 2017-11-15 DIAGNOSIS — M79641 Pain in right hand: Secondary | ICD-10-CM

## 2017-11-15 DIAGNOSIS — M6281 Muscle weakness (generalized): Secondary | ICD-10-CM

## 2017-11-15 DIAGNOSIS — M25641 Stiffness of right hand, not elsewhere classified: Secondary | ICD-10-CM

## 2017-11-15 DIAGNOSIS — M79642 Pain in left hand: Secondary | ICD-10-CM

## 2017-11-15 NOTE — Therapy (Signed)
[facility] [address] [place], [address] Phone: [phone]   Fax:  [phone]  Occupational Therapy Treatment  Patient Details  Name: Teresa Silva MRN: [id number] Date of Birth: [birth year] Referring Provider: Jefm Bryant    Encounter Date: 11/15/2017  OT End of Session - 11/15/17 1240    Visit Number  2    Number of Visits  8    Date for OT Re-Evaluation  12/08/17    OT Start Time  1203    OT Stop Time  1236    OT Time Calculation (min)  33 min    Activity Tolerance  Patient tolerated treatment well;Patient limited by pain    Behavior During Therapy  [facility] for tasks assessed/performed       Past Medical History:  Diagnosis Date  . Arthritis    BACK-LUMBAR-KNEES  . 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 Complication of anesthesia   . GERD (gastroesophageal reflux disease)    OCC  . Personal history of radiation therapy    f/u rt breast cancer  . PONV (postoperative nausea and vomiting)   . Vertigo    IN PAST    Past Surgical History:  Procedure Laterality Date  . BREAST BIOPSY Right 09/12/2015   stereo  . BREAST EXCISIONAL BIOPSY Right 10/13/2015   lumpectomy DCIS  . BREAST LUMPECTOMY Right 09/2015   with Radiation  . BREAST LUMPECTOMY WITH NEEDLE LOCALIZATION AND AXILLARY LYMPH NODE DISSECTION Right 10/13/2015   Procedure: BREAST LUMPECTOMY WITH NEEDLE LOCALIZATION AND AXILLARY LYMPH NODE DISSECTION;  Surgeon: Jules Husbands, MD;  Location: [facility];  Service: General;  Laterality: Right;  . COLONOSCOPY  2009   no polyps  . KNEE ARTHROSCOPY Left 01/07/2017   Procedure: ARTHROSCOPY KNEE;  Surgeon: Leanor Kail, MD;  Location: [place];  Service: Orthopedics;  Laterality: Left;  . SENTINEL NODE BIOPSY Right 10/13/2015   Procedure: SENTINEL NODE BIOPSY;  Surgeon: Jules Husbands, MD;  Location: [facility];  Service: General;  Laterality: Right;    There were no vitals  filed for this visit.  Subjective Assessment - 11/15/17 1239    Subjective   The triggering of my thumb little better - but pain still high and I am so busy - never sit still - so I use my thumb a lot     Patient Stated Goals  I want my pain better, thumb locking better- to be able to use my thumbs and hands with out pain - to grip , pull , lift objects, open jars , cut with food     Pain Score  7     Pain Location  --   Thumb R   Pain Orientation  Right    Pain Descriptors / Indicators  Aching;Throbbing;Stabbing    Pain Type  Acute pain    Pain Onset  More than a month ago    Pain Frequency  Constant       Pt still tender over A1pulley of  R thumb  Pain still 7/10 - pt try to keep it from not triggering - using bandaid  increase PA and RA of thumb   done Graston tool nr 2 for brushing over thenar and volar thumb  And soft tissue mobs in webspace - prior to ionto  Pt to cont with same HEP             OT Treatments/Exercises (OP) - 11/15/17 0001  Iontophoresis   Type of Iontophoresis  Dexamethasone    Location  volar MP of thumb     Dose  small patch    Time  19      Will fabricate thumb spica including IP - if cont to have pain more than 5/10        OT Education - 11/15/17 1240    Education Details  HEP - treatment plan - ionto     Person(s) Educated  Patient    Methods  Explanation;Demonstration    Comprehension  Returned demonstration       OT Short Term Goals - 11/10/17 1758      OT SHORT TERM GOAL #1   Title  Pain decrease on PRWHE by at least 20 points     Baseline  pain at eval on PRWHE 41/50     Time  3    Period  Weeks    Status  New    Target Date  12/01/17      OT SHORT TERM GOAL #2   Title  Pt to show independence in HEP to  decrease pain and triggering of R hand to less than 2 x day     Baseline  thumb triggering constantly during day and pain increase to 1010 at thumb on R and L 8     Time  3    Period  Weeks    Status  New     Target Date  12/01/17        OT Long Term Goals - 11/10/17 1800      OT LONG TERM GOAL #1   Title  Pt to demo and verbalize 3 joint protection and modifications to decrease triggering and pain in bilateral hands     Baseline  no knowledge - grip and use hand - over grip , carrying objects with lat grip and tight grip     Time  4    Period  Weeks    Status  New    Target Date  12/08/17      OT LONG TERM GOAL #2   Title  Function score on PRWHE improve with more than 20 points     Baseline  Function score on PRWHE at eval 27.5/50    Time  4    Period  Weeks    Status  New    Target Date  12/08/17            Plan - 11/15/17 1241    Occupational performance deficits (Please refer to evaluation for details):  ADL's;IADL's;Play;Leisure;Rest and Sleep    Rehab Potential  Good    Current Impairments/barriers affecting progress:  OA, had pain and tenderness - triggering for more than 6 monts     OT Frequency  2x / week    OT Duration  4 weeks    OT Treatment/Interventions  Iontophoresis;Self-care/ADL training;Patient/family education;Splinting;Fluidtherapy;Contrast Bath;Manual Therapy;Passive range of motion;Therapeutic exercise    Plan  assess progress with HEP - if need to fabricate thumb spica - including IP     Clinical Decision Making  Several treatment options, min-mod task modification necessary    OT Home Exercise Plan  see pt instruction     Consulted and Agree with Plan of Care  Patient       Patient will benefit from skilled therapeutic intervention in order to improve the following deficits and impairments:  Pain, Impaired flexibility, Decreased strength, Impaired UE functional use, Decreased knowledge of precautions, Decreased  range of motion  Visit Diagnosis: Pain in right hand  Pain in left hand  Stiffness of right hand, not elsewhere classified  Muscle weakness (generalized)    Problem List Patient Active Problem List   Diagnosis Date Noted  . 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 Abdominal pain, epigastric 02/13/2014  . Degeneration of intervertebral disc of cervical region 02/07/2014  . Muscle ache 02/07/2014  . Back ache 02/07/2013  . Lumbar radiculopathy 02/07/2013    Rosalyn Gess OTR/L, CLT 11/15/2017, 12:42 PM  [facility] [address] [place], [address] Phone: [phone]   Fax:  [phone]  Name: SIMAYA LUMADUE MRN: [id number] Date of Birth: [birth year]

## 2017-11-15 NOTE — Patient Instructions (Signed)
Cont with same HEP  

## 2017-11-17 ENCOUNTER — Ambulatory Visit: Payer: BLUE CROSS/BLUE SHIELD | Admitting: Occupational Therapy

## 2017-11-17 DIAGNOSIS — M6281 Muscle weakness (generalized): Secondary | ICD-10-CM

## 2017-11-17 DIAGNOSIS — M79641 Pain in right hand: Secondary | ICD-10-CM

## 2017-11-17 DIAGNOSIS — M79642 Pain in left hand: Secondary | ICD-10-CM

## 2017-11-17 DIAGNOSIS — M25641 Stiffness of right hand, not elsewhere classified: Secondary | ICD-10-CM

## 2017-11-17 NOTE — Therapy (Signed)
[place] [facility] [address], [place], [address] Phone: [phone]   Fax:  [phone]  Occupational Therapy Treatment  Patient Details  Name: Teresa Silva MRN: [id number] Date of Birth: Aug 15, 1955 Referring Provider: Jefm Bryant    Encounter Date: 11/17/2017  OT End of Session - 11/17/17 1336    Visit Number  3    Number of Visits  8    Date for OT Re-Evaluation  12/08/17    OT Start Time  1304    OT Stop Time  1346    OT Time Calculation (min)  42 min    Activity Tolerance  Patient tolerated treatment well;Patient limited by pain    Behavior During Therapy  [facility] for tasks assessed/performed       Past Medical History:  Diagnosis Date  . Arthritis    BACK-LUMBAR-KNEES  . 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 Complication of anesthesia   . GERD (gastroesophageal reflux disease)    OCC  . Personal history of radiation therapy    f/u rt breast cancer  . PONV (postoperative nausea and vomiting)   . Vertigo    IN PAST    Past Surgical History:  Procedure Laterality Date  . BREAST BIOPSY Right 09/12/2015   stereo  . BREAST EXCISIONAL BIOPSY Right 10/13/2015   lumpectomy DCIS  . BREAST LUMPECTOMY Right 09/2015   with Radiation  . BREAST LUMPECTOMY WITH NEEDLE LOCALIZATION AND AXILLARY LYMPH NODE DISSECTION Right 10/13/2015   Procedure: BREAST LUMPECTOMY WITH NEEDLE LOCALIZATION AND AXILLARY LYMPH NODE DISSECTION;  Surgeon: Jules Husbands, MD;  Location: [facility];  Service: General;  Laterality: Right;  . COLONOSCOPY  2009   no polyps  . KNEE ARTHROSCOPY Left 01/07/2017   Procedure: ARTHROSCOPY KNEE;  Surgeon: Leanor Kail, MD;  Location: [place];  Service: Orthopedics;  Laterality: Left;  . SENTINEL NODE BIOPSY Right 10/13/2015   Procedure: SENTINEL NODE BIOPSY;  Surgeon: Jules Husbands, MD;  Location: [facility];  Service: General;  Laterality: Right;    There were no vitals  filed for this visit.  Subjective Assessment - 11/17/17 1318    Subjective   I  can't wear the bandaid the whole time - if I wear it - it do not click - pain maybe little better and in the am pain better as the day goes - it hurts more     Patient Stated Goals  I want my pain better, thumb locking better- to be able to use my thumbs and hands with out pain - to grip , pull , lift objects, open jars , cut with food     Currently in Pain?  Yes    Pain Score  7     Pain Location  Finger (Comment which one)    Pain Orientation  Right    Pain Descriptors / Indicators  Aching;Throbbing;Stabbing    Pain Type  Acute pain    Pain Onset  More than a month ago    Pain Frequency  Constant       Pt still tender over A1pulley of  R thumb  Pain still 7/10 - pt try to keep it from not triggering - using bandaid  increase PA and RA of thumb   Fabricated thumb gutter splint for extention of IP - pt to keep it on as much as she can to decrease triggering and at night time too.   done contrast -  pain decrease   done Graston tool nr 2 for brushing over thenar and volar thumb  And soft tissue mobs in webspace - prior to ionto  Pt to cont with same HEP           had 3rd session this date- - skin check done and pt to keep it on for hour afterwards  OT Treatments/Exercises (OP) - 11/17/17 0001      Iontophoresis   Type of Iontophoresis  Dexamethasone    Location  volar MP of thumb     Dose  small patch    Time  19      RUE Contrast Bath   Time  11 minutes    Comments  prior to soft tissue and decrease pain              OT Education - 11/17/17 1336    Education Details  HEP - splint [place] - ionto     Person(s) Educated  Patient    Methods  Explanation;Demonstration    Comprehension  Verbalized understanding;Returned demonstration       OT Short Term Goals - 11/10/17 1758      OT SHORT TERM GOAL #1   Title  Pain decrease on PRWHE by at least 20 points     Baseline  pain at eval  on PRWHE 41/50     Time  3    Period  Weeks    Status  New    Target Date  12/01/17      OT SHORT TERM GOAL #2   Title  Pt to show independence in HEP to  decrease pain and triggering of R hand to less than 2 x day     Baseline  thumb triggering constantly during day and pain increase to 1010 at thumb on R and L 8     Time  3    Period  Weeks    Status  New    Target Date  12/01/17        OT Long Term Goals - 11/10/17 1800      OT LONG TERM GOAL #1   Title  Pt to demo and verbalize 3 joint protection and modifications to decrease triggering and pain in bilateral hands     Baseline  no knowledge - grip and use hand - over grip , carrying objects with lat grip and tight grip     Time  4    Period  Weeks    Status  New    Target Date  12/08/17      OT LONG TERM GOAL #2   Title  Function score on PRWHE improve with more than 20 points     Baseline  Function score on PRWHE at eval 27.5/50    Time  4    Period  Weeks    Status  New    Target Date  12/08/17            Plan - 11/17/17 1340    Clinical Impression Statement  Pt pain did decrease to 7/10 but it do increase as the day goes -and I use it more - and triggering - the bandaid at night time helps - no pain in the am - but cannot wear it the whole day - fabricated gutter splint for use to decrease pain and triggeing - pt to wear as much as she can     Occupational performance deficits (Please refer to evaluation for details):  ADL's;IADL's;Play;Leisure;Rest and Sleep    Rehab Potential  Good    Current Impairments/barriers affecting progress:  OA, had pain and tenderness - triggering for more than 6 monts     OT Frequency  2x / week    OT Duration  4 weeks    OT Treatment/Interventions  Iontophoresis;Self-care/ADL training;Patient/family education;Splinting;Fluidtherapy;Contrast Bath;Manual Therapy;Passive range of motion;Therapeutic exercise    Plan  assess progress with HEP -splint use - decrease triggering      Clinical Decision Making  Several treatment options, min-mod task modification necessary    OT Home Exercise Plan  see pt instruction     Consulted and Agree with Plan of Care  Patient       Patient will benefit from skilled therapeutic intervention in order to improve the following deficits and impairments:  Pain, Impaired flexibility, Decreased strength, Impaired UE functional use, Decreased knowledge of precautions, Decreased range of motion  Visit Diagnosis: Pain in right hand  Pain in left hand  Stiffness of right hand, not elsewhere classified  Muscle weakness (generalized)    Problem List Patient Active Problem List   Diagnosis Date Noted  . 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 Abdominal pain, epigastric 02/13/2014  . Degeneration of intervertebral disc of cervical region 02/07/2014  . Muscle ache 02/07/2014  . Back ache 02/07/2013  . Lumbar radiculopathy 02/07/2013    Rosalyn Gess OTR/L,CLT 11/17/2017, 2:03 PM  [facility] [address] [place], [address] Phone: [phone]   Fax:  [phone]  Name: Teresa Silva MRN: [id number] Date of Birth: May 13, 1955

## 2017-11-17 NOTE — Patient Instructions (Signed)
Cont same HEP  

## 2017-11-22 ENCOUNTER — Ambulatory Visit: Payer: BLUE CROSS/BLUE SHIELD | Admitting: Occupational Therapy

## 2017-11-22 DIAGNOSIS — M79641 Pain in right hand: Secondary | ICD-10-CM | POA: Diagnosis not present

## 2017-11-22 DIAGNOSIS — M25641 Stiffness of right hand, not elsewhere classified: Secondary | ICD-10-CM

## 2017-11-22 DIAGNOSIS — M79642 Pain in left hand: Secondary | ICD-10-CM

## 2017-11-22 DIAGNOSIS — M6281 Muscle weakness (generalized): Secondary | ICD-10-CM

## 2017-11-22 NOTE — Therapy (Signed)
[place] [facility] [address] [place], [address] Phone: [phone]   Fax:  [phone]  Occupational Therapy Treatment  Patient Details  Name: Teresa Silva MRN: [id number] Date of Birth: 1955-12-13 Referring Provider: Jefm Bryant    Encounter Date: 11/22/2017  OT End of Session - 11/22/17 1530    Visit Number  4    Number of Visits  8    Date for OT Re-Evaluation  12/08/17    OT Start Time  1400    OT Stop Time  1444    OT Time Calculation (min)  44 min    Activity Tolerance  Patient tolerated treatment well;Patient limited by pain    Behavior During Therapy  [facility] for tasks assessed/performed       Past Medical History:  Diagnosis Date  . Arthritis    BACK-LUMBAR-KNEES  . 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 Complication of anesthesia   . GERD (gastroesophageal reflux disease)    OCC  . Personal history of radiation therapy    f/u rt breast cancer  . PONV (postoperative nausea and vomiting)   . Vertigo    IN PAST    Past Surgical History:  Procedure Laterality Date  . BREAST BIOPSY Right 09/12/2015   stereo  . BREAST EXCISIONAL BIOPSY Right 10/13/2015   lumpectomy DCIS  . BREAST LUMPECTOMY Right 09/2015   with Radiation  . BREAST LUMPECTOMY WITH NEEDLE LOCALIZATION AND AXILLARY LYMPH NODE DISSECTION Right 10/13/2015   Procedure: BREAST LUMPECTOMY WITH NEEDLE LOCALIZATION AND AXILLARY LYMPH NODE DISSECTION;  Surgeon: Jules Husbands, MD;  Location: [facility];  Service: General;  Laterality: Right;  . COLONOSCOPY  2009   no polyps  . KNEE ARTHROSCOPY Left 01/07/2017   Procedure: ARTHROSCOPY KNEE;  Surgeon: Leanor Kail, MD;  Location: [place];  Service: Orthopedics;  Laterality: Left;  . SENTINEL NODE BIOPSY Right 10/13/2015   Procedure: SENTINEL NODE BIOPSY;  Surgeon: Jules Husbands, MD;  Location: [facility];  Service: General;  Laterality: Right;    There were no vitals  filed for this visit.  Subjective Assessment - 11/22/17 1525    Subjective   Think pain is little better - but it is hard to not  use this thumb - I am always busy - is this the rest of my life    Patient Stated Goals  I want my pain better, thumb locking better- to be able to use my thumbs and hands with out pain - to grip , pull , lift objects, open jars , cut with food     Currently in Pain?  Yes    Pain Score  5     Pain Location  Finger (Comment which one)    Pain Orientation  Right    Pain Descriptors / Indicators  Aching;Throbbing;Stabbing    Pain Type  Acute pain         Pt still tender over A1pulley of R thumb but improving  Pain decrease to 5/10 - pt try to keep it from not triggering - use since last time  A thumb gutter splint for extention of IP that OT fabricated - pt to keep it on as much as she can to decrease triggering and at night time too.   done contrast - pain decrease  done Graston tool nr 2 for brushing over thenar and volar thumb  And soft tissue mobs in webspace - prior to ionto  Pt to cont with same HEP          skin check done prior and pt to keep on for about hour OT Treatments/Exercises (OP) - 11/22/17 0001      Iontophoresis   Type of Iontophoresis  Dexamethasone    Location  volar MP of thumb     Dose  small patch    Time  19      RUE Contrast Bath   Time  11 minutes    Comments  prior to soft tissue        Pt's pain improving since [facility] and decrease triggering of R thumb  - but pt need thumb splint to wear to keep it still - she use her R hand and thumb so much - pt to see Dr Jefm Bryant Thursday - will check if want to cont ionto and get shot maybe from MD       OT Education - 11/22/17 1530    Education Details  HEP - splint [place] - ionto     Person(s) Educated  Patient    Methods  Explanation;Demonstration    Comprehension  Verbalized understanding;Returned demonstration       OT Short Term Goals - 11/10/17 1758      OT  SHORT TERM GOAL #1   Title  Pain decrease on PRWHE by at least 20 points     Baseline  pain at eval on PRWHE 41/50     Time  3    Period  Weeks    Status  New    Target Date  12/01/17      OT SHORT TERM GOAL #2   Title  Pt to show independence in HEP to  decrease pain and triggering of R hand to less than 2 x day     Baseline  thumb triggering constantly during day and pain increase to 1010 at thumb on R and L 8     Time  3    Period  Weeks    Status  New    Target Date  12/01/17        OT Long Term Goals - 11/10/17 1800      OT LONG TERM GOAL #1   Title  Pt to demo and verbalize 3 joint protection and modifications to decrease triggering and pain in bilateral hands     Baseline  no knowledge - grip and use hand - over grip , carrying objects with lat grip and tight grip     Time  4    Period  Weeks    Status  New    Target Date  12/08/17      OT LONG TERM GOAL #2   Title  Function score on PRWHE improve with more than 20 points     Baseline  Function score on PRWHE at eval 27.5/50    Time  4    Period  Weeks    Status  New    Target Date  12/08/17            Plan - 11/22/17 1531    Occupational performance deficits (Please refer to evaluation for details):  ADL's;IADL's;Play;Leisure;Rest and Sleep    Rehab Potential  Good    Current Impairments/barriers affecting progress:  OA, had pain and tenderness - triggering for more than 6 monts     OT Frequency  2x / week    OT Duration  2 weeks    OT Treatment/Interventions  Iontophoresis;Self-care/ADL  training;Patient/family education;Splinting;Fluidtherapy;Contrast Bath;Manual Therapy;Passive range of motion;Therapeutic exercise    Plan  assess progress with HEP -splint use - decrease triggering  and results from MD visit    Clinical Decision Making  Several treatment options, min-mod task modification necessary    OT Home Exercise Plan  see pt instruction     Consulted and Agree with Plan of Care  Patient        Patient will benefit from skilled therapeutic intervention in order to improve the following deficits and impairments:  Pain, Impaired flexibility, Decreased strength, Impaired UE functional use, Decreased knowledge of precautions, Decreased range of motion  Visit Diagnosis: Pain in right hand  Stiffness of right hand, not elsewhere classified  Muscle weakness (generalized)  Pain in left hand    Problem List Patient Active Problem List   Diagnosis Date Noted  . 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 Abdominal pain, epigastric 02/13/2014  . Degeneration of intervertebral disc of cervical region 02/07/2014  . Muscle ache 02/07/2014  . Back ache 02/07/2013  . Lumbar radiculopathy 02/07/2013    Rosalyn Gess OTR/L,CLT  11/22/2017, 3:32 PM  [place] [address], [place], [address] Phone: [phone]   Fax:  [phone]  Name: SHAQUAVIA WHISONANT MRN: [id number] Date of Birth: July 04, 1955

## 2017-11-22 NOTE — Patient Instructions (Signed)
Same

## 2017-11-24 ENCOUNTER — Ambulatory Visit: Payer: BLUE CROSS/BLUE SHIELD | Admitting: Occupational Therapy

## 2018-03-15 ENCOUNTER — Encounter: Payer: Self-pay | Admitting: Gastroenterology

## 2018-03-15 ENCOUNTER — Ambulatory Visit: Payer: BLUE CROSS/BLUE SHIELD | Admitting: Gastroenterology

## 2018-03-15 VITALS — BP 146/77 | HR 81 | Ht 63.0 in | Wt 131.2 lb

## 2018-03-15 DIAGNOSIS — K5900 Constipation, unspecified: Secondary | ICD-10-CM | POA: Diagnosis not present

## 2018-03-15 NOTE — Patient Instructions (Signed)
Miralax daily  High-Fiber Diet Fiber, also called dietary fiber, is a type of carbohydrate that is found in fruits, vegetables, whole grains, and beans. A high-fiber diet can have many health benefits. Your health care provider may recommend a high-fiber diet to help:  Prevent constipation. Fiber can make your bowel movements more regular.  Lower your cholesterol.  Relieve the following conditions: ? Swelling of veins in the anus (hemorrhoids). ? Swelling and irritation (inflammation) of specific areas of the digestive tract (uncomplicated diverticulosis). ? A problem of the large intestine (colon) that sometimes causes pain and diarrhea (irritable bowel syndrome, IBS).  Prevent overeating as part of a weight-loss plan.  Prevent heart disease, type 2 diabetes, and certain cancers. What is my plan? The recommended daily fiber intake in grams (g) includes:  38 g for men age 24 or younger.  30 g for men over age 30.  55 g for women age 55 or younger.  21 g for women over age 67. You can get the recommended daily intake of dietary fiber by:  Eating a variety of fruits, vegetables, grains, and beans.  Taking a fiber supplement, if it is not possible to get enough fiber through your diet. What do I need to know about a high-fiber diet?  It is better to get fiber through food sources rather than from fiber supplements. There is not a lot of research about how effective supplements are.  Always check the fiber content on the nutrition facts label of any prepackaged food. Look for foods that contain 5 g of fiber or more per serving.  Talk with a diet and nutrition specialist (dietitian) if you have questions about specific foods that are recommended or not recommended for your medical condition, especially if those foods are not listed below.  Gradually increase how much fiber you consume. If you increase your intake of dietary fiber too quickly, you may have bloating, cramping, or  gas.  Drink plenty of water. Water helps you to digest fiber. What are tips for following this plan?  Eat a wide variety of high-fiber foods.  Make sure that half of the grains that you eat each day are whole grains.  Eat breads and cereals that are made with whole-grain flour instead of refined flour or white flour.  Eat brown rice, bulgur wheat, or millet instead of white rice.  Start the day with a breakfast that is high in fiber, such as a cereal that contains 5 g of fiber or more per serving.  Use beans in place of meat in soups, salads, and pasta dishes.  Eat high-fiber snacks, such as berries, raw vegetables, nuts, and popcorn.  Choose whole fruits and vegetables instead of processed forms like juice or sauce. What foods can I eat?  Fruits Berries. Pears. Apples. Oranges. Avocado. Prunes and raisins. Dried figs. Vegetables Sweet potatoes. Spinach. Kale. Artichokes. Cabbage. Broccoli. Cauliflower. Green peas. Carrots. Squash. Grains Whole-grain breads. Multigrain cereal. Oats and oatmeal. Brown rice. Barley. Bulgur wheat. [place]. Quinoa. Bran muffins. Popcorn. Rye wafer crackers. Meats and other proteins Navy, kidney, and pinto beans. Soybeans. Split peas. Lentils. Nuts and seeds. Dairy Fiber-fortified yogurt. Beverages Fiber-fortified soy milk. Fiber-fortified orange juice. Other foods Fiber bars. The items listed above may not be a complete list of recommended foods and beverages. Contact a dietitian for more options. What foods are not recommended? Fruits Fruit juice. Cooked, strained fruit. Vegetables Fried potatoes. Canned vegetables. Well-cooked vegetables. Grains White bread. Pasta made with refined flour. White rice.  Meats and other proteins Fatty cuts of meat. Fried chicken or fried fish. Dairy Milk. Yogurt. Cream cheese. Sour cream. Fats and oils Butters. Beverages Soft drinks. Other foods Cakes and pastries. The items listed above may not be a  complete list of foods and beverages to avoid. Contact a dietitian for more information. Summary  Fiber is a type of carbohydrate. It is found in fruits, vegetables, whole grains, and beans.  There are many health benefits of eating a high-fiber diet, such as preventing constipation, lowering blood cholesterol, helping with weight loss, and reducing your risk of heart disease, diabetes, and certain cancers.  Gradually increase your intake of fiber. Increasing too fast can result in cramping, bloating, and gas. Drink plenty of water while you increase your fiber.  The best sources of fiber include whole fruits and vegetables, whole grains, nuts, seeds, and beans. This information is not intended to replace advice given to you by your health care provider. Make sure you discuss any questions you have with your health care provider. Document Released: 03/15/2005 Document Revised: 01/17/2017 Document Reviewed: 01/17/2017 Elsevier Interactive Patient Education  2019 [employer].

## 2018-03-15 NOTE — Progress Notes (Signed)
Teresa Silva [address]  [place]  [place], [place] [address]  Main: [phone]  Fax: [phone]   Gastroenterology Consultation  Referring Provider:     [name], MD Primary Care Physician:  [name], MD Primary Gastroenterologist:  Dr. [name] Reason for Consultation:     Constipation        HPI:    Chief Complaint  Patient presents with  . New Patient (Initial Visit)    Referral fro Teresa Bosworth, PA    Teresa Silva is a 62 y.o. y/o female referred for consultation & management  by Dr. Lennox Grumbles, Connye Burkitt, MD.  Patient reports chronic history of constipation.  Is on Linzess and dose was recently increased which has not helped.  States has to take MiraLAX after 4 days of not having bowel movements despite the Linzess and then will have 2-3 loose bowel movements after taking MiraLAX.  Has never consistently taking MiraLAX in the past.  No weight loss. The patient denies abdominal or flank pain, anorexia, nausea or vomiting, dysphagia, change in bowel habits or black or bloody stools or weight loss.  No family history of colon cancer.  Reports history of a colonoscopy in 2009 that she states was normal.  Was also seen by [facility] in 2016 and states at that time was having heartburn and was on Dexilant, and has not been on any PPIs or H2 RA or any acid reducers and has not had any problems in years.  Had an EGD with their team, and I do not have the region report.  However, there is a pathology report as follows:  DIAGNOSIS:  A. STOMACH, ANTRUM; MUCOSAL BIOPSIES:  - REACTIVE GASTROPATHY, MILD.  - NO HELICOBACTER PYLORI ORGANISMS OR DYSPLASIA SEEN.  - SEE COMMENT.   B. STOMACH, BODY; MUCOSAL BIOPSIES:  - CHRONIC GASTRITIS, MINIMAL AND INACTIVE.  - NO HELICOBACTER PYLORI ORGANISMS OR DYSPLASIA SEEN.   C. DUODENUM; MUCOSAL BIOPSIES:  - BRUNNER GLAND HYPERPLASIA.  - NO ACTIVE DUODENITIS, INCREASE IN INTRAEPITHELIAL LYMPHOCYTES,  OR  DYSPLASIA SEEN.   D. GASTROESOPHAGEAL JUNCTION; MUCOSAL BIOPSIES:  - REACTIVE CHANGES AND MILD FOVEOLAR HYPERPLASIA.  - NO ACTIVE ESOPHAGITIS, DISTINCTIVE TYPE METAPLASTIC GLANDULAR  EPITHELIUM, OR DYSPLASIA SEEN.    Comment:  The histologic differential includes drug/chemical induced injury  (including NSAIDs), bile reflux, and changes adjacent to an area of  healing ulceration.   Upper GI series with small bowel follow-through in March 2016 reported to be normal CT abdomen March 2016 reported focal narrowing of third portion of the duodenum.  The EGD reportedly evaluated this area.  As per [facility] clinic GI notes: "Had CT abdomen/pelvis following, which was concerning for some narrowing of the 3rd portion of the duodenum between the SMA and aorta- EGD was recommended- however had this procedure a couple weeks prior and biopsies of duodenum negative and no narrowing noted on report.  EGD for luminal evaluation and was started on Dexilant. EGd revealed a small hiatal hernia, patulous GEJ and a flat duodenum.Biopsies revealed some reflux esophagitis and gastritis. They were negative for H pylori, Barretts, celiac sprue, dysplasia, malignancy Says she had colonscopy 2009 without polyps- due to repeat 2019."  The above GI note that mentions narrowing between the SMA and aorta, the CT specifically mentions that "the SMA on this study does not appear to be directly compressing the third portion of the duodenum".  Past Medical History:  Diagnosis Date  . Arthritis  BACK-LUMBAR-KNEES  . 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 Complication of anesthesia   . GERD (gastroesophageal reflux disease)    OCC  . Personal history of radiation therapy    f/u rt breast cancer  . PONV (postoperative nausea and vomiting)   . Vertigo    IN PAST    Past Surgical History:  Procedure Laterality Date  . BREAST BIOPSY Right 09/12/2015   stereo  . BREAST EXCISIONAL BIOPSY Right  10/13/2015   lumpectomy DCIS  . BREAST LUMPECTOMY Right 09/2015   with Radiation  . BREAST LUMPECTOMY WITH NEEDLE LOCALIZATION AND AXILLARY LYMPH NODE DISSECTION Right 10/13/2015   Procedure: BREAST LUMPECTOMY WITH NEEDLE LOCALIZATION AND AXILLARY LYMPH NODE DISSECTION;  Surgeon: Jules Husbands, MD;  Location: [facility];  Service: General;  Laterality: Right;  . COLONOSCOPY  2009   no polyps  . KNEE ARTHROSCOPY Left 01/07/2017   Procedure: ARTHROSCOPY KNEE;  Surgeon: Leanor Kail, MD;  Location: [facility];  Service: Orthopedics;  Laterality: Left;  . SENTINEL NODE BIOPSY Right 10/13/2015   Procedure: SENTINEL NODE BIOPSY;  Surgeon: Jules Husbands, MD;  Location: [facility];  Service: General;  Laterality: Right;    Prior to Admission medications   Medication Sig Start Date End Date Taking? Authorizing Provider  acetaminophen (TYLENOL) 325 MG tablet Take 650 mg by mouth every 6 (six) hours as needed for mild pain.    Yes [provider]  escitalopram (LEXAPRO) 10 MG tablet 10 mg.  09/28/16  Yes [provider]  gabapentin (NEURONTIN) 300 MG capsule Take by mouth. 06/29/17  Yes [provider]  linaclotide (LINZESS) 290 MCG CAPS capsule Take 290 mcg by mouth daily before breakfast.   Yes [provider]  Multiple Vitamin (MULTIVITAMIN) tablet Take 1 tablet by mouth daily.   Yes [provider]  ranitidine (ZANTAC) 150 MG tablet Take 150 mg by mouth as needed for heartburn.   Yes [provider]  HYDROcodone-acetaminophen (NORCO) 5-325 MG tablet Take 1-2 tablets by mouth every 6 (six) hours as needed for moderate pain. MAXIMUM TOTAL ACETAMINOPHEN DOSE IS 4000 MG PER DAY Patient not taking: Reported on 03/15/2018 01/07/17   Leanor Kail, MD    Family History  Problem Relation Age of Onset  . Hypertension Mother   . Lung cancer Mother   . Arthritis Mother   . Hypertension Father   . COPD Father   . Liver cancer Sister   . Breast  cancer Cousin      Social History   Tobacco Use  . Smoking status: Never Smoker  . Smokeless tobacco: Never Used  Substance Use Topics  . Alcohol use: Yes    Comment: RARE  . Drug use: No    Allergies as of 03/15/2018 - Review Complete 03/15/2018  Allergen Reaction Noted  . Tramadol hcl Other (See Comments) 09/17/2015    Review of Systems:    All systems reviewed and negative except where noted in HPI.   Physical Exam:  BP (!) 146/77   Pulse 81   Ht 5\' 3"  (1.6 m)   Wt 131 lb 3.2 oz (59.5 kg)   BMI 23.24 kg/m  No LMP recorded. Patient is postmenopausal. Psych:  Alert and cooperative. Normal mood and affect. General:   Alert,  Well-developed, well-nourished, pleasant and cooperative in NAD Head:  Normocephalic and atraumatic. Eyes:  Sclera clear, no icterus.   Conjunctiva pink. Ears:  Normal auditory acuity. Nose:  No deformity, discharge,  or lesions. Mouth:  No deformity or lesions,oropharynx pink & moist. Neck:  Supple; no masses or thyromegaly. Abdomen:  Normal bowel sounds.  No bruits.  Soft, non-tender and non-distended without masses, hepatosplenomegaly or hernias noted.  No guarding or rebound tenderness.    Msk:  Symmetrical without gross deformities. Good, equal movement & strength bilaterally. Pulses:  Normal pulses noted. Extremities:  No clubbing or edema.  No cyanosis. Neurologic:  Alert and oriented x3;  grossly normal neurologically. Skin:  Intact without significant lesions or rashes. No jaundice. Lymph Nodes:  No significant cervical adenopathy. Psych:  Alert and cooperative. Normal mood and affect.   Labs: CBC    Component Value Date/Time   WBC 5.5 12/16/2015 1339   RBC 3.95 12/16/2015 1339   HGB 11.9 (L) 12/16/2015 1339   HGB 12.1 04/27/2013 2125   HCT 35.0 12/16/2015 1339   HCT 36.3 04/27/2013 2125   PLT 200 12/16/2015 1339   PLT 225 04/27/2013 2125   MCV 88.5 12/16/2015 1339   MCV 89 04/27/2013 2125   MCH 30.2 12/16/2015 1339   MCHC  34.1 12/16/2015 1339   RDW 13.1 12/16/2015 1339   RDW 12.9 04/27/2013 2125   CMP     Component Value Date/Time   NA 135 11/03/2015 2009   NA 143 04/27/2013 2125   K 3.5 11/03/2015 2009   K 3.5 04/27/2013 2125   CL 105 11/03/2015 2009   CL 108 (H) 04/27/2013 2125   CO2 22 11/03/2015 2009   CO2 31 04/27/2013 2125   GLUCOSE 134 (H) 11/03/2015 2009   GLUCOSE 102 (H) 04/27/2013 2125   BUN 13 11/03/2015 2009   BUN 14 04/27/2013 2125   CREATININE 0.77 11/03/2015 2009   CREATININE 0.99 04/27/2013 2125   CALCIUM 8.7 (L) 11/03/2015 2009   CALCIUM 8.5 04/27/2013 2125   PROT 7.0 04/27/2013 2125   ALBUMIN 3.6 04/27/2013 2125   AST 17 04/27/2013 2125   ALT 17 04/27/2013 2125   ALKPHOS 77 04/27/2013 2125   BILITOT 0.2 04/27/2013 2125   GFRNONAA >60 11/03/2015 2009   GFRNONAA >60 04/27/2013 2125   GFRAA >60 11/03/2015 2009   GFRAA >60 04/27/2013 2125    Imaging Studies: No results found.  Assessment and Plan:   BATOOL MAJID is a 62 y.o. y/o female has been referred for constipation  Discontinue Linzess as it is not helping MiraLAX works for the patient but she is never taken it on a daily basis She does not eat a high-fiber diet Constipation is likely due to lack of fiber High-fiber diet MiraLAX daily with goal of 1-2 soft bowel movements daily.  If not at goal, patient instructed to increase dose to twice daily.  If loose stools with the medication, patient asked to decrease the medication to every other day, or half dose daily.  Patient verbalized understanding  Patient is due for screening colonoscopy which will also allow [place] to rule out any large lesions I have discussed alternative options, risks & benefits,  which include, but are not limited to, bleeding, infection, perforation,respiratory complication & drug reaction.  The patient agrees with this plan & written consent will be obtained.    In regard to her previous GI work-up, she did denies any further abdominal pain  or heartburn for which [facility] She does not have any clinical signs of SMA syndrome, and CT scan specifically mentions that the superior mesenteric artery does not appear to  be directly compressing the third portion of the duodenum.  In addition the EGD did not note any narrowing in this area.  Hepatic steatosis was mentioned on her last CT scan in 2016  I will discuss hepatic steatosis with the patient at the time of her screening colonoscopy and encourage weight loss with diet and exercise  I will discussed her previous work-up that I have reviewed as above and discuss further management.  Please see notes at the time of her colonoscopy  Dr Teresa Silva  Speech recognition software was used to dictate the above note.

## 2018-03-17 ENCOUNTER — Other Ambulatory Visit: Payer: Self-pay

## 2018-03-17 DIAGNOSIS — Z1211 Encounter for screening for malignant neoplasm of colon: Secondary | ICD-10-CM

## 2018-04-19 ENCOUNTER — Ambulatory Visit
Admission: RE | Admit: 2018-04-19 | Discharge: 2018-04-19 | Disposition: A | Discharge status: Home / Self Care | Payer: PRIVATE HEALTH INSURANCE | Attending: Gastroenterology | Admitting: Gastroenterology

## 2018-04-19 ENCOUNTER — Encounter
Admission: RE | Disposition: A | Discharge status: Home / Self Care | Payer: Self-pay | Source: Home / Self Care | Attending: Gastroenterology

## 2018-04-19 ENCOUNTER — Ambulatory Visit: Payer: PRIVATE HEALTH INSURANCE | Admitting: Anesthesiology

## 2018-04-19 ENCOUNTER — Encounter: Payer: Self-pay | Admitting: *Deleted

## 2018-04-19 ENCOUNTER — Telehealth: Payer: Self-pay | Admitting: Gastroenterology

## 2018-04-19 DIAGNOSIS — Z86000 Personal history of in-situ neoplasm of breast: Secondary | ICD-10-CM | POA: Diagnosis not present

## 2018-04-19 DIAGNOSIS — K635 Polyp of colon: Secondary | ICD-10-CM

## 2018-04-19 DIAGNOSIS — K219 Gastro-esophageal reflux disease without esophagitis: Secondary | ICD-10-CM | POA: Insufficient documentation

## 2018-04-19 DIAGNOSIS — D125 Benign neoplasm of sigmoid colon: Secondary | ICD-10-CM

## 2018-04-19 DIAGNOSIS — Z923 Personal history of irradiation: Secondary | ICD-10-CM | POA: Insufficient documentation

## 2018-04-19 DIAGNOSIS — Z886 Allergy status to analgesic agent status: Secondary | ICD-10-CM | POA: Diagnosis not present

## 2018-04-19 DIAGNOSIS — K6389 Other specified diseases of intestine: Secondary | ICD-10-CM | POA: Insufficient documentation

## 2018-04-19 DIAGNOSIS — Z79899 Other long term (current) drug therapy: Secondary | ICD-10-CM | POA: Insufficient documentation

## 2018-04-19 DIAGNOSIS — Z1211 Encounter for screening for malignant neoplasm of colon: Secondary | ICD-10-CM

## 2018-04-19 DIAGNOSIS — K5730 Diverticulosis of large intestine without perforation or abscess without bleeding: Secondary | ICD-10-CM | POA: Insufficient documentation

## 2018-04-19 HISTORY — PX: COLONOSCOPY WITH PROPOFOL: SHX5780

## 2018-04-19 SURGERY — COLONOSCOPY WITH PROPOFOL
Anesthesia: General

## 2018-04-19 MED ORDER — PROPOFOL 10 MG/ML IV BOLUS
INTRAVENOUS | Status: AC
  Filled 2018-04-19: qty 20

## 2018-04-19 MED ORDER — PROPOFOL 10 MG/ML IV BOLUS
INTRAVENOUS | Status: DC | PRN
  Administered 2018-04-19: 40 mg via INTRAVENOUS
  Administered 2018-04-19 (×2): 60 mg via INTRAVENOUS

## 2018-04-19 MED ORDER — PROPOFOL 500 MG/50ML IV EMUL
INTRAVENOUS | Status: DC | PRN
  Administered 2018-04-19: 150 ug/kg/min via INTRAVENOUS

## 2018-04-19 MED ORDER — LIDOCAINE HCL (PF) 2 % IJ SOLN
INTRAMUSCULAR | Status: AC
  Filled 2018-04-19: qty 10

## 2018-04-19 MED ORDER — LIDOCAINE HCL (CARDIAC) PF 100 MG/5ML IV SOSY
PREFILLED_SYRINGE | INTRAVENOUS | Status: DC | PRN
  Administered 2018-04-19: 100 mg via INTRAVENOUS

## 2018-04-19 MED ORDER — SODIUM CHLORIDE 0.9 % IV SOLN
INTRAVENOUS | Status: DC
  Administered 2018-04-19 (×2): via INTRAVENOUS

## 2018-04-19 NOTE — Anesthesia Preprocedure Evaluation (Signed)
Anesthesia Evaluation  Patient identified by MRN, date of birth, ID band Patient awake    Reviewed: Allergy & Precautions, NPO status , Patient's Chart, lab work & pertinent test results  History of Anesthesia Complications (+) PONV and history of anesthetic complications  Airway Mallampati: I  TM Distance: >3 FB Neck ROM: Full    Dental  (+) Dental Advidsory Given, Missing   Pulmonary neg shortness of breath, neg sleep apnea, neg COPD, Recent URI , Resolved,    Pulmonary exam normal - rhonchi (-) wheezing      Cardiovascular Exercise Tolerance: Good (-) hypertension(-) CAD and (-) Past MI negative cardio ROS   Rhythm:Regular Rate:Normal - Systolic murmurs and - Diastolic murmurs    Neuro/Psych negative neurological ROS     GI/Hepatic Neg liver ROS, GERD  Controlled,  Endo/Other  negative endocrine ROSneg diabetes  Renal/GU negative Renal ROS     Musculoskeletal  (+) Arthritis , Osteoarthritis,    Abdominal Normal abdominal exam  (+)   Peds  Hematology negative hematology ROS (+)   Anesthesia Other Findings Past Medical History: No date: Arthritis     Comment:  BACK-LUMBAR-KNEES 10/14/2015: Breast cancer ([place])     Comment:  right DCIS No date: Complication of anesthesia No date: GERD (gastroesophageal reflux disease)     Comment:  OCC No date: Personal history of radiation therapy     Comment:  f/u rt breast cancer No date: PONV (postoperative nausea and vomiting) No date: Vertigo     Comment:  IN PAST   Reproductive/Obstetrics                             Anesthesia Physical  Anesthesia Plan  ASA: II  Anesthesia Plan: General   Post-op Pain Management:    Induction: Intravenous  PONV Risk Score and Plan: 3 and Propofol infusion and TIVA  Airway Management Planned: Natural Airway and Nasal Cannula  Additional Equipment:   Intra-op Plan:   Post-operative Plan:    Informed Consent: I have reviewed the patients History and Physical, chart, labs and discussed the procedure including the risks, benefits and alternatives for the proposed anesthesia with the patient or authorized representative who has indicated his/her understanding and acceptance.     Dental advisory given  Plan Discussed with:   Anesthesia Plan Comments:         Anesthesia Quick Evaluation

## 2018-04-19 NOTE — Telephone Encounter (Signed)
-----   Message from Virgel Manifold, MD sent at 04/19/2018  9:12 AM EST -----  Please set up clinic appointment with me in 1-2 months for fatty liver

## 2018-04-19 NOTE — Op Note (Signed)
[facility] Gastroenterology Patient Name: Teresa Silva Procedure Date: 04/19/2018 9:02 AM MRN: [id number] Account #: [id number] Date of Birth: 04/09/1955 Admit Type: Outpatient Age: 63 Room: [facility] ENDO ROOM 4 Gender: Female Note Status: Finalized Procedure:            Colonoscopy Indications:          Screening for colorectal malignant neoplasm Providers:            Yoltzin Barg B. Bonna Gains MD, MD Medicines:            Monitored Anesthesia Care Complications:        No immediate complications. Procedure:            Pre-Anesthesia Assessment:                       - ASA Grade Assessment: II - A patient with mild                        systemic disease.                       - Prior to the procedure, a History and Physical was                        performed, and patient medications, allergies and                        sensitivities were reviewed. The patient's tolerance of                        previous anesthesia was reviewed.                       - The risks and benefits of the procedure and the                        sedation options and risks were discussed with the                        patient. All questions were answered and informed                        consent was obtained.                       - Patient identification and proposed procedure were                        verified prior to the procedure by the physician, the                        nurse, the anesthesiologist, the anesthetist and the                        technician. The procedure was verified in the procedure                        room.                       After obtaining informed consent, the colonoscope was  passed under direct vision. Throughout the procedure,                        the patient's blood pressure, pulse, and oxygen                        saturations were monitored continuously. The                        Colonoscope was introduced through the  anus and                        advanced to the the cecum, identified by appendiceal                        orifice and ileocecal valve. The colonoscopy was                        performed with ease. The patient tolerated the                        procedure well. The quality of the bowel preparation                        was good. The total duration of the procedure was                        longer than usual, as Colonoscope had to be changed as                        air button did not work. Findings:      The perianal and digital rectal examinations were normal.      A 5 mm polyp was found in the sigmoid colon. The polyp was sessile. The       polyp was removed with a cold biopsy forceps. Resection and retrieval       were complete. To stop active bleeding, two hemostatic clips were       successfully placed. There was no bleeding at the end of the procedure.       The polyp was next to but not within a diverticulum and thus decision       was made to remove it. It could represent an inflammatory polyp.      Multiple diverticula were found in the sigmoid colon.      The exam was otherwise without abnormality.      The rectum, sigmoid colon, descending colon, transverse colon, ascending       colon and cecum appeared normal.      The retroflexed view of the distal rectum and anal verge was normal and       showed no anal or rectal abnormalities. Impression:           - One 5 mm polyp in the sigmoid colon, removed with a                        cold biopsy forceps. Resected and retrieved. Clips were                        placed.                       -  Diverticulosis in the sigmoid colon.                       - The examination was otherwise normal.                       - The rectum, sigmoid colon, descending colon,                        transverse colon, ascending colon and cecum are normal.                       - The distal rectum and anal verge are normal on                         retroflexion view. Recommendation:       - Discharge patient to home (with escort).                       - Advance diet as tolerated.                       - Continue present medications.                       - Await pathology results.                       - Repeat colonoscopy date to be determined after                        pending pathology results are reviewed.                       - The findings and recommendations were discussed with                        the patient.                       - The findings and recommendations were discussed with                        the patient's family.                       - Return to primary care physician as previously                        scheduled.                       - High fiber diet. Procedure Code(s):    --- Professional ---                       [phone], Colonoscopy, flexible; with control of bleeding,                        any method Diagnosis Code(s):    --- Professional ---                       Z12.11, Encounter for screening for malignant neoplasm  of colon                       D12.5, Benign neoplasm of sigmoid colon                       K57.30, Diverticulosis of large intestine without                        perforation or abscess without bleeding CPT copyright 2018 American Medical Association. All rights reserved. The codes documented in this report are preliminary and upon coder review may  be revised to meet current compliance requirements.  [name], MD Margretta Sidle B. Bonna Gains MD, MD 04/19/2018 10:31:36 AM This report has been signed electronically. Number of Addenda: 0 Note Initiated On: 04/19/2018 9:02 AM Scope Withdrawal Time: 0 hours 20 minutes 44 seconds  Total Procedure Duration: 0 hours 57 minutes 49 seconds  Estimated Blood Loss: Estimated blood loss: none.      [facility]

## 2018-04-19 NOTE — Telephone Encounter (Signed)
Left vm for pt to call office and schedule apt to see Dr.T in 1-2 month

## 2018-04-19 NOTE — Anesthesia Post-op Follow-up Note (Signed)
Anesthesia QCDR form completed.        

## 2018-04-19 NOTE — Anesthesia Postprocedure Evaluation (Signed)
Anesthesia Post Note  Patient: Teresa Silva  Procedure(s) Performed: COLONOSCOPY WITH PROPOFOL (N/A )  Patient location during evaluation: Endoscopy Anesthesia Type: General Level of consciousness: awake and alert Pain management: pain level controlled Vital Signs Assessment: post-procedure vital signs reviewed and stable Respiratory status: spontaneous breathing, nonlabored ventilation, respiratory function stable and patient connected to nasal cannula oxygen Cardiovascular status: blood pressure returned to baseline and stable Postop Assessment: no apparent nausea or vomiting Anesthetic complications: no     Last Vitals:  Vitals:   04/19/18 1040 04/19/18 1050  BP: 120/70 (!) 125/92  Pulse: 82 75  Resp: 14 16  Temp:    SpO2: 98% 100%    Last Pain:  Vitals:   04/19/18 1020  TempSrc: Tympanic                 Martha Clan

## 2018-04-19 NOTE — Transfer of Care (Signed)
Immediate Anesthesia Transfer of Care Note  Patient: Teresa Silva  Procedure(s) Performed: COLONOSCOPY WITH PROPOFOL (N/A )  Patient Location: Endoscopy Unit  Anesthesia Type:General  Level of Consciousness: awake  Airway & Oxygen Therapy: Patient Spontanous Breathing and Patient connected to nasal cannula oxygen  Post-op Assessment: Report given to RN and Post -op Vital signs reviewed and stable  Post vital signs: Reviewed and stable  Last Vitals:  Vitals Value Taken Time  BP 99/54 04/19/2018 10:20 AM  Temp 36.2 C 04/19/2018 10:20 AM  Pulse 74 04/19/2018 10:27 AM  Resp 11 04/19/2018 10:27 AM  SpO2 99 % 04/19/2018 10:27 AM  Vitals shown include unvalidated device data.  Last Pain:  Vitals:   04/19/18 1020  TempSrc: Tympanic         Complications: No apparent anesthesia complications

## 2018-04-19 NOTE — H&P (Addendum)
[name], MD [address], [place], [place], [place], [address] [address], [place], [place], [place], [address] Phone: [phone]  Fax: [phone]  Primary Care Physician:  [name], MD   Pre-Procedure History & Physical: HPI:  Teresa Silva is a 63 y.o. female is here for a colonoscopy.   Past Medical History:  Diagnosis Date  . Arthritis    BACK-LUMBAR-KNEES  . 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 Complication of anesthesia   . GERD (gastroesophageal reflux disease)    OCC  . Personal history of radiation therapy    f/u rt breast cancer  . PONV (postoperative nausea and vomiting)   . Vertigo    IN PAST    Past Surgical History:  Procedure Laterality Date  . BREAST BIOPSY Right 09/12/2015   stereo  . BREAST EXCISIONAL BIOPSY Right 10/13/2015   lumpectomy DCIS  . BREAST LUMPECTOMY Right 09/2015   with Radiation  . BREAST LUMPECTOMY WITH NEEDLE LOCALIZATION AND AXILLARY LYMPH NODE DISSECTION Right 10/13/2015   Procedure: BREAST LUMPECTOMY WITH NEEDLE LOCALIZATION AND AXILLARY LYMPH NODE DISSECTION;  Surgeon: Jules Husbands, MD;  Location: [facility];  Service: General;  Laterality: Right;  . COLONOSCOPY  2009   no polyps  . KNEE ARTHROSCOPY Left 01/07/2017   Procedure: ARTHROSCOPY KNEE;  Surgeon: Leanor Kail, MD;  Location: [facility];  Service: Orthopedics;  Laterality: Left;  . SENTINEL NODE BIOPSY Right 10/13/2015   Procedure: SENTINEL NODE BIOPSY;  Surgeon: Jules Husbands, MD;  Location: [facility];  Service: General;  Laterality: Right;    Prior to Admission medications   Medication Sig Start Date End Date Taking? Authorizing Provider  escitalopram (LEXAPRO) 10 MG tablet 10 mg.  09/28/16  Yes [provider]  gabapentin (NEURONTIN) 300 MG capsule Take by mouth. 06/29/17  Yes [provider]  acetaminophen (TYLENOL) 325 MG tablet Take 650 mg by mouth every 6 (six) hours as needed for mild pain.      [provider]  HYDROcodone-acetaminophen (NORCO) 5-325 MG tablet Take 1-2 tablets by mouth every 6 (six) hours as needed for moderate pain. MAXIMUM TOTAL ACETAMINOPHEN DOSE IS 4000 MG PER DAY Patient not taking: Reported on 03/15/2018 01/07/17   Leanor Kail, MD  linaclotide [facility]) 290 MCG CAPS capsule Take 290 mcg by mouth daily before breakfast.    [provider]  Multiple Vitamin (MULTIVITAMIN) tablet Take 1 tablet by mouth daily.    [provider]  ranitidine (ZANTAC) 150 MG tablet Take 150 mg by mouth as needed for heartburn.    [provider]    Allergies as of 03/17/2018 - Review Complete 03/15/2018  Allergen Reaction Noted  . Tramadol hcl Other (See Comments) 09/17/2015    Family History  Problem Relation Age of Onset  . Hypertension Mother   . Lung cancer Mother   . Arthritis Mother   . Hypertension Father   . COPD Father   . Liver cancer Sister   . Breast cancer Cousin     Social History   Socioeconomic History  . Marital status: Married    Spouse name: Not on file  . Number of children: Not on file  . Years of education: Not on file  . Highest education level: Not on file  Occupational History  . Not on file  Social Needs  . Financial resource strain: Not on file  . Food insecurity:    Worry: Not on file  Inability: Not on file  . Transportation needs:    Medical: Not on file    Non-medical: Not on file  Tobacco Use  . Smoking status: Never Smoker  . Smokeless tobacco: Never Used  Substance and Sexual Activity  . Alcohol use: Yes    Comment: RARE  . Drug use: No  . Sexual activity: Not on file  Lifestyle  . Physical activity:    Days per week: Not on file    Minutes per session: Not on file  . Stress: Not on file  Relationships  . Social connections:    Talks on phone: Not on file    Gets together: Not on file    Attends religious service: Not on file    Active member of club or organization: Not  on file    Attends meetings of clubs or organizations: Not on file    Relationship status: Not on file  . Intimate partner violence:    Fear of current or ex partner: Not on file    Emotionally abused: Not on file    Physically abused: Not on file    Forced sexual activity: Not on file  Other Topics Concern  . Not on file  Social History Narrative  . Not on file    Review of Systems: See HPI, otherwise negative ROS  Physical Exam: BP 131/77   Pulse (!) 112   Temp (!) 96.6 F (35.9 C) (Tympanic)   Resp 18   Ht 5\' 3"  (1.6 m)   Wt 59 kg   LMP 04/13/2007   BMI 23.03 kg/m  General:   Alert,  pleasant and cooperative in NAD Head:  Normocephalic and atraumatic. Neck:  Supple; no masses or thyromegaly. Lungs:  Clear throughout to auscultation, normal respiratory effort.    Heart:  +S1, +S2, Regular rate and rhythm, No edema. Abdomen:  Soft, nontender and nondistended. Normal bowel sounds, without guarding, and without rebound.   Neurologic:  Alert and  oriented x4;  grossly normal neurologically.  Impression/Plan: Teresa Silva is here for a colonoscopy to be performed for average risk screening.  Risks, benefits, limitations, and alternatives regarding  colonoscopy have been reviewed with the patient.  Questions have been answered.  All parties agreeable.  Finding of fatty liver on imaging discussed with patient Diet, weight loss, and exercise encouraged along with avoiding hepatotoxic drugs including alcohol Risk of progression to cirrhosis if above measures are not instituted were discussed as well, and patient verbalized understanding  No abdominal pain. Previous 2016 EGD report reviewed and discussed with patient (see provation). Pt to follow up in GI clinic to discuss fatty liver as well and any further workup needed. Pt agreeable   Virgel Manifold, MD  04/19/2018, 9:12 AM

## 2018-04-20 ENCOUNTER — Encounter: Payer: Self-pay | Admitting: Gastroenterology

## 2018-04-20 ENCOUNTER — Telehealth: Payer: Self-pay | Admitting: Gastroenterology

## 2018-04-20 LAB — SURGICAL PATHOLOGY

## 2018-04-20 NOTE — Telephone Encounter (Signed)
Left vm for pt to call office and schedule apt with Dr. Darene Lamer for Fatty liver

## 2018-04-20 NOTE — Telephone Encounter (Signed)
-----   Message from Virgel Manifold, MD sent at 04/20/2018  2:49 PM EST -----  Please set up clinic appointment with me in 3-4 months to discuss fatty liver workup

## 2018-04-27 ENCOUNTER — Telehealth: Payer: Self-pay

## 2018-04-27 ENCOUNTER — Encounter: Payer: Self-pay | Admitting: Gastroenterology

## 2018-04-27 ENCOUNTER — Ambulatory Visit (INDEPENDENT_AMBULATORY_CARE_PROVIDER_SITE_OTHER): Payer: PRIVATE HEALTH INSURANCE | Admitting: Gastroenterology

## 2018-04-27 VITALS — BP 114/78 | HR 88 | Ht 63.0 in | Wt 125.4 lb

## 2018-04-27 DIAGNOSIS — K760 Fatty (change of) liver, not elsewhere classified: Secondary | ICD-10-CM | POA: Diagnosis not present

## 2018-04-27 NOTE — Telephone Encounter (Signed)
LMTCO. RUQ ultrasound scheduled for [facility] Date: 05/04/2018 Time: 11:00 am Arrival time 10:45 am at the medical mall entrance, registration desk. Nothing to eat or drink after midnight. (Could not get afternoon appt, they only do these in the am).

## 2018-04-28 NOTE — Progress Notes (Signed)
[name], MD [address]  [place]  [place], [place] [address]  Main: [phone]  Fax: [phone]   Primary Care Physician: [name], MD   Chief Complaint  Patient presents with  . Follow-up    Fatty liver    HPI: MCKINZY FULLER is a 63 y.o. female here for follow-up of fatty liver.  Patient denies any previous or recent history of heavy alcohol use.  Drinks occasionally about once or twice a month.  1-2 drinks in a sitting.  Denies any previous history of hepatitis.    Denies any further constipation. The patient denies abdominal or flank pain, anorexia, nausea or vomiting, dysphagia, change in bowel habits or black or bloody stools or weight loss.   Underwent screening colonoscopy on April 20, 2019.  One 5 mm sigmoid colon polyp removed and biopsies showed inflammatory polyp.  Exam otherwise normal except for diverticulosis.  No family history of colon cancer.  Reports history of a colonoscopy in 2009 that she states was normal.  Was also seen by [facility] GI in 2016 and states at that time was having heartburn and was on Dexilant, and has not been on any PPIs or H2 RA or any acid reducers and has not had any problems in years.  Had an EGD with their team, and I do not have the region report.  However, there is a pathology report as follows:  DIAGNOSIS:  A. STOMACH, ANTRUM; MUCOSAL BIOPSIES:  - REACTIVE GASTROPATHY, MILD.  - NO HELICOBACTER PYLORI ORGANISMS OR DYSPLASIA SEEN.  - SEE COMMENT.   B. STOMACH, BODY; MUCOSAL BIOPSIES:  - CHRONIC GASTRITIS, MINIMAL AND INACTIVE.  - NO HELICOBACTER PYLORI ORGANISMS OR DYSPLASIA SEEN.   C. DUODENUM; MUCOSAL BIOPSIES:  - BRUNNER GLAND HYPERPLASIA.  - NO ACTIVE DUODENITIS, INCREASE IN INTRAEPITHELIAL LYMPHOCYTES, OR  DYSPLASIA SEEN.   D. GASTROESOPHAGEAL JUNCTION; MUCOSAL BIOPSIES:  - REACTIVE CHANGES AND MILD FOVEOLAR HYPERPLASIA.  - NO ACTIVE ESOPHAGITIS, DISTINCTIVE TYPE METAPLASTIC  GLANDULAR  EPITHELIUM, OR DYSPLASIA SEEN.    Comment:  The histologic differential includes drug/chemical induced injury  (including NSAIDs), bile reflux, and changes adjacent to an area of  healing ulceration.   Upper GI series with small bowel follow-through in March 2016 reported to be normal CT abdomen March 2016 reported focal narrowing of third portion of the duodenum.  The EGD reportedly evaluated this area.  As per coronal clinic GI notes: "Had CT abdomen/pelvis following, which was concerning for some narrowing of the 3rd portion of the duodenum between the SMA and aorta- EGD was recommended- however had this procedure a couple weeks prior and biopsies of duodenum negative and no narrowing noted on report.  EGD for luminal evaluation and was started on Dexilant. EGd revealed a small hiatal hernia, patulous GEJ and a flat duodenum.Biopsies revealed some reflux esophagitis and gastritis. They were negative for H pylori, Barretts, celiac sprue, dysplasia, malignancy Says she had colonscopy 2009 without polyps- due to repeat 2019."  The above GI note that mentions narrowing between the SMA and aorta, the CT specifically mentions that "the SMA on this study does not appear to be directly compressing the third portion of the duodenum".  Current Outpatient Medications  Medication Sig Dispense Refill  . acetaminophen (TYLENOL) 325 MG tablet Take 650 mg by mouth every 6 (six) hours as needed for mild pain.     Marland Kitchen escitalopram (LEXAPRO) 10 MG tablet 10 mg.     . gabapentin (NEURONTIN) 300  MG capsule Take by mouth.    . Multiple Vitamin (MULTIVITAMIN) tablet Take 1 tablet by mouth daily.    . sulindac (CLINORIL) 200 MG tablet      No current facility-administered medications for this visit.     Allergies as of 04/27/2018 - Review Complete 04/27/2018  Allergen Reaction Noted  . Tramadol hcl Other (See Comments) 09/17/2015    ROS:  General: Negative for anorexia, weight loss,  fever, chills, fatigue, weakness. ENT: Negative for hoarseness, difficulty swallowing , nasal congestion. CV: Negative for chest pain, angina, palpitations, dyspnea on exertion, peripheral edema.  Respiratory: Negative for dyspnea at rest, dyspnea on exertion, cough, sputum, wheezing.  GI: See history of present illness. GU:  Negative for dysuria, hematuria, urinary incontinence, urinary frequency, nocturnal urination.  Endo: Negative for unusual weight change.    Physical Examination:   BP 114/78   Pulse 88   Ht 5\' 3"  (1.6 m)   Wt 125 lb 6.4 oz (56.9 kg)   LMP 04/13/2007   BMI 22.21 kg/m   General: Well-nourished, well-developed in no acute distress.  Eyes: No icterus. Conjunctivae pink. Mouth: Oropharyngeal mucosa moist and pink , no lesions erythema or exudate. Neck: Supple, Trachea midline Abdomen: Bowel sounds are normal, nontender, nondistended, no hepatosplenomegaly or masses, no abdominal bruits or hernia , no rebound or guarding.   Extremities: No lower extremity edema. No clubbing or deformities. Neuro: Alert and oriented x 3.  Grossly intact. Skin: Warm and dry, no jaundice.   Psych: Alert and cooperative, normal mood and affect.   Labs: CMP     Component Value Date/Time   NA 135 11/03/2015 2009   NA 143 04/27/2013 2125   K 3.5 11/03/2015 2009   K 3.5 04/27/2013 2125   CL 105 11/03/2015 2009   CL 108 (H) 04/27/2013 2125   CO2 22 11/03/2015 2009   CO2 31 04/27/2013 2125   GLUCOSE 134 (H) 11/03/2015 2009   GLUCOSE 102 (H) 04/27/2013 2125   BUN 13 11/03/2015 2009   BUN 14 04/27/2013 2125   CREATININE 0.77 11/03/2015 2009   CREATININE 0.99 04/27/2013 2125   CALCIUM 8.7 (L) 11/03/2015 2009   CALCIUM 8.5 04/27/2013 2125   PROT 7.0 04/27/2013 2125   ALBUMIN 3.6 04/27/2013 2125   AST 17 04/27/2013 2125   ALT 17 04/27/2013 2125   ALKPHOS 77 04/27/2013 2125   BILITOT 0.2 04/27/2013 2125   GFRNONAA >60 11/03/2015 2009   GFRNONAA >60 04/27/2013 2125   GFRAA >60  11/03/2015 2009   GFRAA >60 04/27/2013 2125   Lab Results  Component Value Date   WBC 5.5 12/16/2015   HGB 11.9 (L) 12/16/2015   HCT 35.0 12/16/2015   MCV 88.5 12/16/2015   PLT 200 12/16/2015    Imaging Studies: No results found.  Assessment and Plan:   CECILIE HEIDEL is a 63 y.o. y/o female here for follow-up of fatty liver  Finding of fatty liver on imaging discussed with patient Diet, weight loss, and exercise encouraged along with avoiding hepatotoxic drugs including alcohol Risk of progression to cirrhosis if above measures are not instituted were discussed as well, and patient verbalized understanding  We will check for immunity to hepatitis A and B and if not immune, recommend vaccination We will also screen for hepatitis C We will check liver enzymes as well  Next screening colonoscopy indicated in 10 years   Dr [name]

## 2018-05-02 ENCOUNTER — Telehealth: Payer: Self-pay | Admitting: Gastroenterology

## 2018-05-02 NOTE — Telephone Encounter (Signed)
Patient came in today for ab work & to check when her CT Scan was scheduled for. Please call.

## 2018-05-03 LAB — HEPATITIS B SURFACE ANTIBODY,QUALITATIVE: Hep B Surface Ab, Qual: REACTIVE

## 2018-05-03 LAB — COMPREHENSIVE METABOLIC PANEL
ALT: 9 IU/L (ref 0–32)
AST: 14 IU/L (ref 0–40)
Albumin/Globulin Ratio: 1.3 (ref 1.2–2.2)
Albumin: 4.1 g/dL (ref 3.8–4.8)
Alkaline Phosphatase: 70 IU/L (ref 39–117)
BUN/Creatinine Ratio: 26 (ref 12–28)
BUN: 18 mg/dL (ref 8–27)
Bilirubin Total: 0.2 mg/dL (ref 0.0–1.2)
CO2: 23 mmol/L (ref 20–29)
Calcium: 9.1 mg/dL (ref 8.7–10.3)
Chloride: 106 mmol/L (ref 96–106)
Creatinine, Ser: 0.69 mg/dL (ref 0.57–1.00)
GFR calc Af Amer: 108 mL/min/{1.73_m2} (ref >59)
GFR calc non Af Amer: 94 mL/min/{1.73_m2} (ref >59)
Globulin, Total: 3.2 g/dL (ref 1.5–4.5)
Glucose: 88 mg/dL (ref 65–99)
Potassium: 4 mmol/L (ref 3.5–5.2)
Sodium: 142 mmol/L (ref 134–144)
Total Protein: 7.3 g/dL (ref 6.0–8.5)

## 2018-05-03 LAB — HCV COMMENT:

## 2018-05-03 LAB — HEPATITIS B CORE ANTIBODY, TOTAL: Hep B Core Total Ab: NEGATIVE

## 2018-05-03 LAB — HEPATITIS B SURFACE ANTIGEN: Hepatitis B Surface Ag: NEGATIVE

## 2018-05-03 LAB — HEPATITIS A ANTIBODY, TOTAL: Hep A Total Ab: NEGATIVE

## 2018-05-03 LAB — HEPATITIS C ANTIBODY (REFLEX): HCV Ab: <0.1 s/co ratio (ref 0.0–0.9)

## 2018-05-03 NOTE — Telephone Encounter (Signed)
Pt left vm for Debbie to check when her u/s apt is scheduled for and instructionhs

## 2018-05-04 ENCOUNTER — Ambulatory Visit
Admission: RE | Admit: 2018-05-04 | Discharge: 2018-05-04 | Disposition: A | Discharge status: Home / Self Care | Payer: PRIVATE HEALTH INSURANCE | Source: Ambulatory Visit | Attending: Gastroenterology | Admitting: Gastroenterology

## 2018-05-04 DIAGNOSIS — K760 Fatty (change of) liver, not elsewhere classified: Secondary | ICD-10-CM | POA: Diagnosis not present

## 2018-05-05 IMAGING — MG MM DIGITAL SCREENING BILAT W/ CAD
6 series · 6 of 6 positions shown · non-contrast
Comparison: Previous exam(s).

CLINICAL DATA: Screening.

EXAM:
DIGITAL SCREENING BILATERAL MAMMOGRAM WITH CAD

[L XCCM]
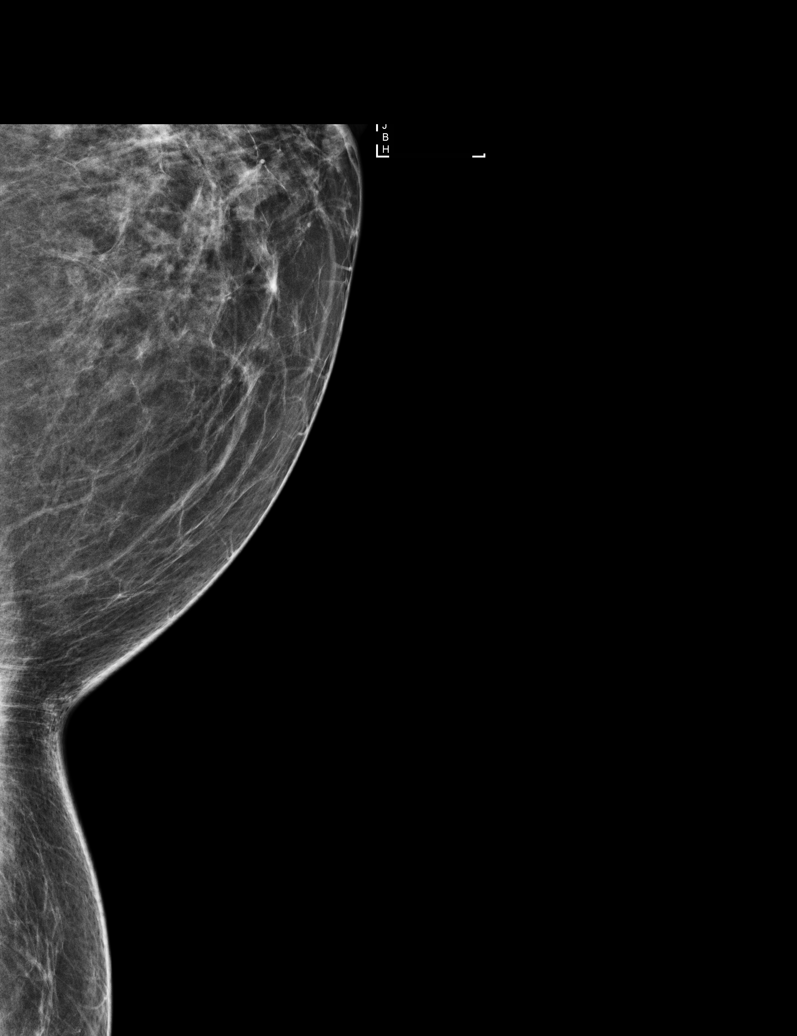

[L MLO]
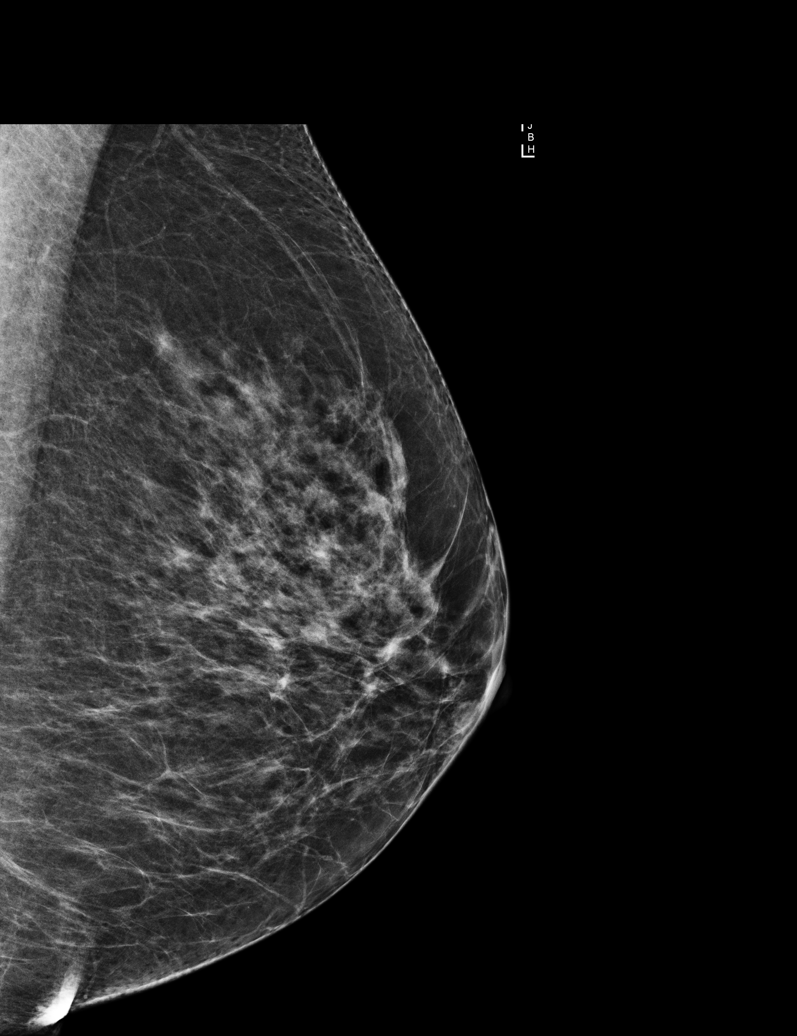

[R CC]
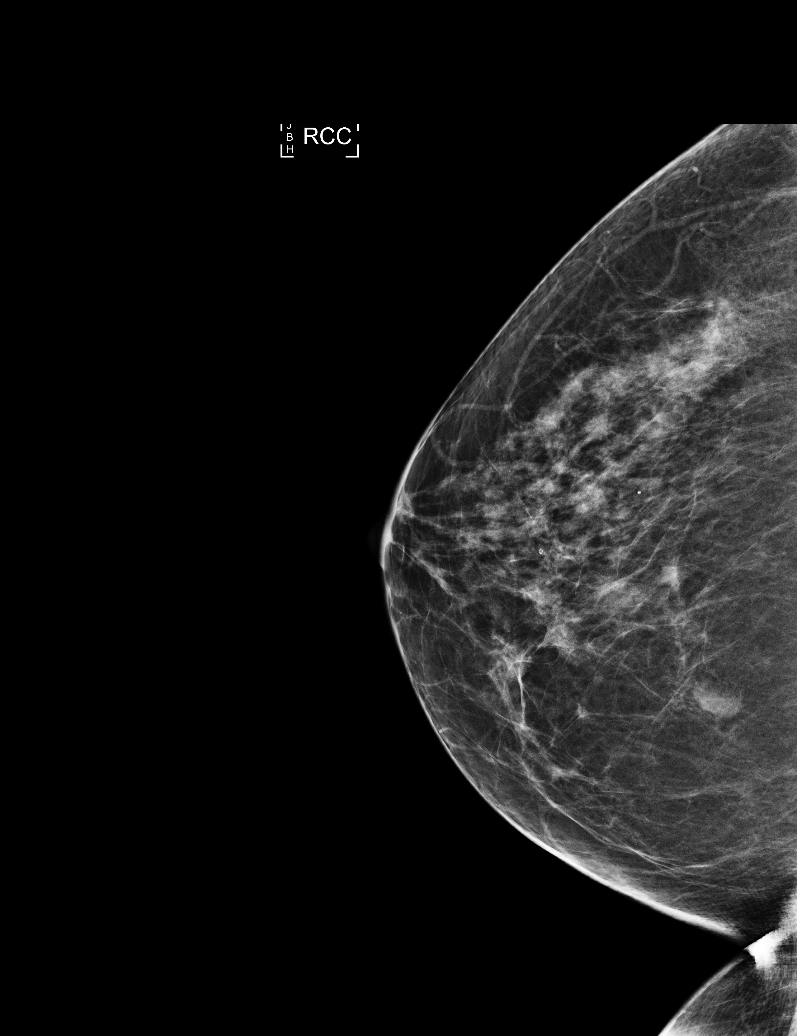

[R MLO (1 of 2)]
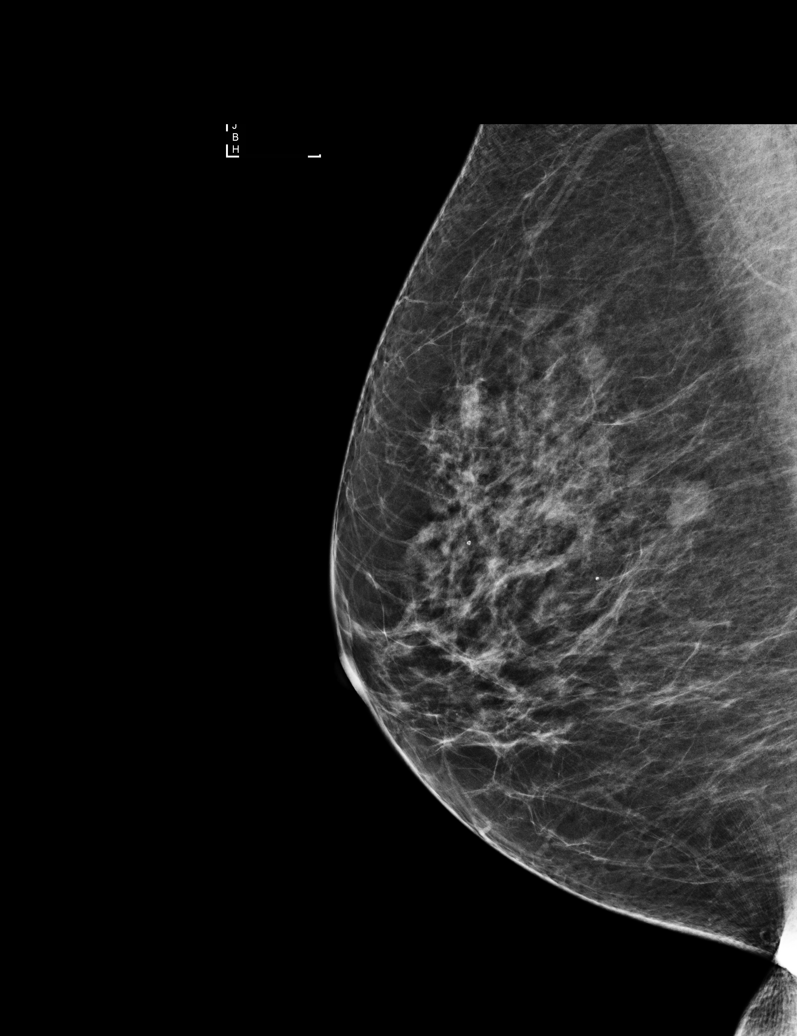

[R MLO (2 of 2)]
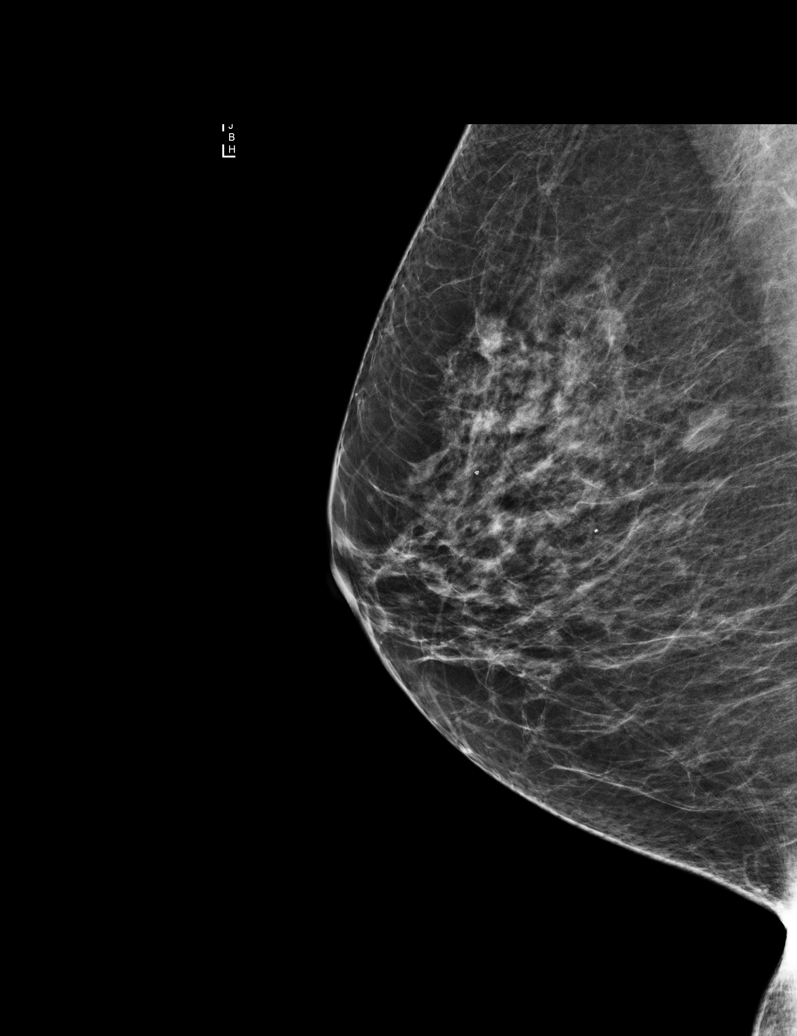

[L CC]
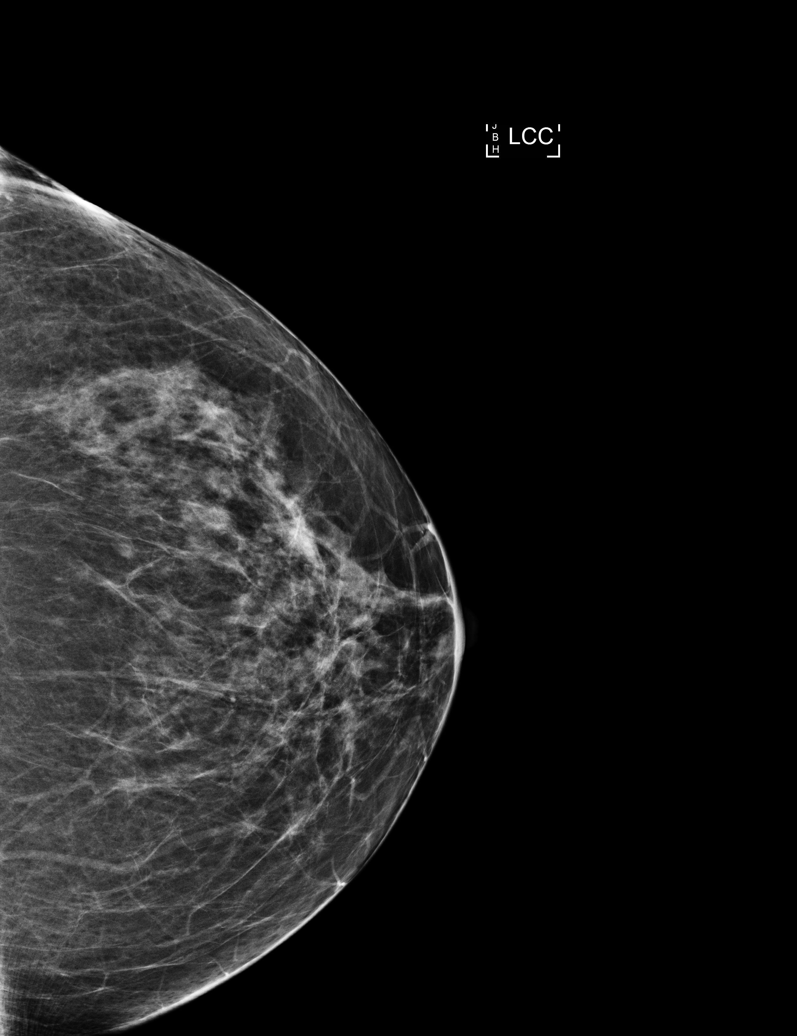

[6 of 6 positions shown; findings below may reference images not displayed]

ACR Breast Density Category b: There are scattered areas of
fibroglandular density.
FINDINGS: In the right breast, microcalcifications/possible mass warrant
further evaluation with magnified views. In the left breast, no
findings suspicious for malignancy. Images were processed with CAD.
IMPRESSION: Further evaluation is suggested for microcalcifications/possible
mass in the right breast.

RECOMMENDATION:
Diagnostic mammogram of the right breast. (Code:[id number])

The patient will be contacted regarding the findings, and additional
imaging will be scheduled.

BI-RADS CATEGORY  0: Incomplete. Need additional imaging evaluation
and/or prior mammograms for comparison.

## 2018-05-05 NOTE — Telephone Encounter (Signed)
Pt had this done.

## 2018-05-12 ENCOUNTER — Telehealth: Payer: Self-pay

## 2018-05-12 NOTE — Telephone Encounter (Signed)
-----   Message from Virgel Manifold, MD sent at 05/05/2018 12:11 PM EST ----- Teresa Silva please let patient know, her lab work shows normal liver enzymes.  Her ultrasound did not show any fatty liver.  The fatty liver present on her 2016 CT scan may have reversed and thus is not seen on her ultrasound.  She does not have any evidence of viral hepatitis.

## 2018-05-12 NOTE — Telephone Encounter (Signed)
LMTCO.

## 2018-05-15 NOTE — Telephone Encounter (Signed)
Pt returns call and informed of results.

## 2019-01-29 DIAGNOSIS — K5730 Diverticulosis of large intestine without perforation or abscess without bleeding: Secondary | ICD-10-CM

## 2019-04-20 ENCOUNTER — Other Ambulatory Visit: Payer: Self-pay | Admitting: Physical Medicine and Rehabilitation

## 2019-04-20 DIAGNOSIS — M5416 Radiculopathy, lumbar region: Secondary | ICD-10-CM

## 2019-04-25 ENCOUNTER — Ambulatory Visit: Payer: PRIVATE HEALTH INSURANCE

## 2019-05-28 ENCOUNTER — Other Ambulatory Visit: Payer: Self-pay | Admitting: Family Medicine

## 2019-05-28 DIAGNOSIS — Z853 Personal history of malignant neoplasm of breast: Secondary | ICD-10-CM

## 2019-06-18 ENCOUNTER — Ambulatory Visit
Admission: RE | Admit: 2019-06-18 | Discharge: 2019-06-18 | Disposition: A | Discharge status: Home / Self Care | Payer: BLUE CROSS/BLUE SHIELD | Source: Ambulatory Visit | Attending: Family Medicine | Admitting: Family Medicine

## 2019-06-18 ENCOUNTER — Other Ambulatory Visit: Payer: Self-pay | Admitting: Family Medicine

## 2019-06-18 DIAGNOSIS — Z853 Personal history of malignant neoplasm of breast: Secondary | ICD-10-CM

## 2019-06-18 DIAGNOSIS — N6310 Unspecified lump in the right breast, unspecified quadrant: Secondary | ICD-10-CM | POA: Insufficient documentation

## 2019-06-20 ENCOUNTER — Other Ambulatory Visit: Payer: Self-pay | Admitting: Family Medicine

## 2019-06-20 DIAGNOSIS — N6310 Unspecified lump in the right breast, unspecified quadrant: Secondary | ICD-10-CM

## 2019-06-20 DIAGNOSIS — R928 Other abnormal and inconclusive findings on diagnostic imaging of breast: Secondary | ICD-10-CM

## 2019-06-26 ENCOUNTER — Ambulatory Visit
Admission: RE | Admit: 2019-06-26 | Discharge: 2019-06-26 | Disposition: A | Discharge status: Home / Self Care | Payer: BLUE CROSS/BLUE SHIELD | Source: Ambulatory Visit | Attending: Family Medicine | Admitting: Family Medicine

## 2019-06-26 DIAGNOSIS — R928 Other abnormal and inconclusive findings on diagnostic imaging of breast: Secondary | ICD-10-CM | POA: Insufficient documentation

## 2019-06-26 DIAGNOSIS — N6310 Unspecified lump in the right breast, unspecified quadrant: Secondary | ICD-10-CM | POA: Diagnosis present

## 2019-06-26 HISTORY — PX: BREAST BIOPSY: SHX20

## 2019-06-27 LAB — SURGICAL PATHOLOGY

## 2019-08-15 ENCOUNTER — Other Ambulatory Visit: Payer: Self-pay | Admitting: Neurosurgery

## 2019-08-15 DIAGNOSIS — M5442 Lumbago with sciatica, left side: Secondary | ICD-10-CM

## 2019-09-06 ENCOUNTER — Ambulatory Visit (HOSPITAL_COMMUNITY): Payer: BLUE CROSS/BLUE SHIELD

## 2019-09-06 ENCOUNTER — Encounter (HOSPITAL_COMMUNITY): Payer: Self-pay

## 2020-10-27 ENCOUNTER — Other Ambulatory Visit (HOSPITAL_COMMUNITY): Payer: Self-pay | Admitting: Neurosurgery

## 2020-10-27 DIAGNOSIS — G8929 Other chronic pain: Secondary | ICD-10-CM

## 2020-10-27 DIAGNOSIS — M4807 Spinal stenosis, lumbosacral region: Secondary | ICD-10-CM

## 2020-11-05 ENCOUNTER — Other Ambulatory Visit: Payer: Self-pay

## 2020-11-05 ENCOUNTER — Ambulatory Visit
Admission: RE | Admit: 2020-11-05 | Discharge: 2020-11-05 | Disposition: A | Discharge status: Home / Self Care | Payer: Medicare HMO | Source: Ambulatory Visit | Attending: Neurosurgery | Admitting: Neurosurgery

## 2020-11-05 DIAGNOSIS — G8929 Other chronic pain: Secondary | ICD-10-CM | POA: Insufficient documentation

## 2020-11-05 DIAGNOSIS — M4807 Spinal stenosis, lumbosacral region: Secondary | ICD-10-CM | POA: Insufficient documentation

## 2020-11-05 DIAGNOSIS — M549 Dorsalgia, unspecified: Secondary | ICD-10-CM | POA: Insufficient documentation

## 2021-01-09 DIAGNOSIS — R200 Anesthesia of skin: Secondary | ICD-10-CM | POA: Insufficient documentation

## 2021-03-02 ENCOUNTER — Other Ambulatory Visit: Payer: Self-pay | Admitting: Family Medicine

## 2021-03-15 ENCOUNTER — Emergency Department (HOSPITAL_COMMUNITY)
Admission: EM | Admit: 2021-03-15 | Discharge: 2021-03-15 | Disposition: A | Discharge status: Home / Self Care | Payer: Medicare HMO | Attending: Emergency Medicine | Admitting: Emergency Medicine

## 2021-03-15 ENCOUNTER — Emergency Department (HOSPITAL_COMMUNITY): Payer: Medicare HMO

## 2021-03-15 ENCOUNTER — Encounter (HOSPITAL_COMMUNITY): Payer: Self-pay | Admitting: *Deleted

## 2021-03-15 ENCOUNTER — Other Ambulatory Visit: Payer: Self-pay

## 2021-03-15 DIAGNOSIS — Z853 Personal history of malignant neoplasm of breast: Secondary | ICD-10-CM | POA: Insufficient documentation

## 2021-03-15 DIAGNOSIS — R059 Cough, unspecified: Secondary | ICD-10-CM | POA: Insufficient documentation

## 2021-03-15 DIAGNOSIS — R110 Nausea: Secondary | ICD-10-CM | POA: Diagnosis not present

## 2021-03-15 DIAGNOSIS — Z96652 Presence of left artificial knee joint: Secondary | ICD-10-CM | POA: Insufficient documentation

## 2021-03-15 DIAGNOSIS — R0602 Shortness of breath: Secondary | ICD-10-CM | POA: Insufficient documentation

## 2021-03-15 DIAGNOSIS — J189 Pneumonia, unspecified organism: Secondary | ICD-10-CM

## 2021-03-15 DIAGNOSIS — Z20822 Contact with and (suspected) exposure to covid-19: Secondary | ICD-10-CM | POA: Insufficient documentation

## 2021-03-15 LAB — CBC WITH DIFFERENTIAL/PLATELET
Band Neutrophils: 9 %
Basophils Absolute: 0 10*3/uL (ref 0.0–0.1)
Basophils Relative: 0 %
Eosinophils Absolute: 0 10*3/uL (ref 0.0–0.5)
Eosinophils Relative: 0 %
HCT: 35.8 % — ABNORMAL LOW (ref 36.0–46.0)
Hemoglobin: 11.8 g/dL — ABNORMAL LOW (ref 12.0–15.0)
Lymphocytes Relative: 7 %
Lymphs Abs: 0.6 10*3/uL — ABNORMAL LOW (ref 0.7–4.0)
MCH: 30.4 pg (ref 26.0–34.0)
MCHC: 33 g/dL (ref 30.0–36.0)
MCV: 92.3 fL (ref 80.0–100.0)
Metamyelocytes Relative: 3 %
Monocytes Absolute: 0.6 10*3/uL (ref 0.1–1.0)
Monocytes Relative: 7 %
Neutro Abs: 7 10*3/uL (ref 1.7–7.7)
Neutrophils Relative %: 74 %
Platelets: 188 10*3/uL (ref 150–400)
RBC: 3.88 MIL/uL (ref 3.87–5.11)
RDW: 13.1 % (ref 11.5–15.5)
WBC: 8.4 10*3/uL (ref 4.0–10.5)
nRBC: 0 % (ref 0.0–0.2)

## 2021-03-15 LAB — BASIC METABOLIC PANEL
Anion gap: 9 (ref 5–15)
BUN: 21 mg/dL (ref 8–23)
CO2: 26 mmol/L (ref 22–32)
Calcium: 8.4 mg/dL — ABNORMAL LOW (ref 8.9–10.3)
Chloride: 99 mmol/L (ref 98–111)
Creatinine, Ser: 0.72 mg/dL (ref 0.44–1.00)
GFR, Estimated: >60 mL/min (ref >60)
Glucose, Bld: 120 mg/dL — ABNORMAL HIGH (ref 70–99)
Potassium: 3.3 mmol/L — ABNORMAL LOW (ref 3.5–5.1)
Sodium: 134 mmol/L — ABNORMAL LOW (ref 135–145)

## 2021-03-15 LAB — RESP PANEL BY RT-PCR (FLU A&B, COVID) ARPGX2
Influenza A by PCR: NEGATIVE
Influenza B by PCR: NEGATIVE
SARS Coronavirus 2 by RT PCR: NEGATIVE

## 2021-03-15 LAB — MAGNESIUM: Magnesium: 1.9 mg/dL (ref 1.7–2.4)

## 2021-03-15 MED ORDER — FENTANYL CITRATE PF 50 MCG/ML IJ SOSY
50.0000 ug | PREFILLED_SYRINGE | Freq: Once | INTRAMUSCULAR | Status: AC
  Administered 2021-03-15: 19:00:00 50 ug via INTRAVENOUS
  Filled 2021-03-15: qty 1

## 2021-03-15 MED ORDER — POTASSIUM CHLORIDE 10 MEQ/100ML IV SOLN
10.0000 meq | INTRAVENOUS | Status: AC
  Administered 2021-03-15 (×2): 10 meq via INTRAVENOUS
  Filled 2021-03-15 (×2): qty 100

## 2021-03-15 MED ORDER — SODIUM CHLORIDE 0.9 % IV BOLUS
500.0000 mL | Freq: Once | INTRAVENOUS | Status: AC
  Administered 2021-03-15: 16:00:00 500 mL via INTRAVENOUS

## 2021-03-15 MED ORDER — SODIUM CHLORIDE 0.9 % IV SOLN
500.0000 mg | INTRAVENOUS | Status: DC
  Administered 2021-03-15: 19:00:00 500 mg via INTRAVENOUS
  Filled 2021-03-15: qty 5

## 2021-03-15 MED ORDER — AMOXICILLIN-POT CLAVULANATE 875-125 MG PO TABS: 1.0000 | ORAL_TABLET | Freq: Two times a day (BID) | ORAL | 0 refills | Status: DC

## 2021-03-15 MED ORDER — SODIUM CHLORIDE 0.9 % IV BOLUS
1000.0000 mL | Freq: Once | INTRAVENOUS | Status: AC
  Administered 2021-03-15: 18:00:00 1000 mL via INTRAVENOUS

## 2021-03-15 MED ORDER — SODIUM CHLORIDE 0.9 % IV SOLN
1.0000 g | Freq: Once | INTRAVENOUS | Status: AC
  Administered 2021-03-15: 18:00:00 1 g via INTRAVENOUS
  Filled 2021-03-15: qty 10

## 2021-03-15 NOTE — ED Notes (Signed)
Patient given water and crackers. 

## 2021-03-15 NOTE — ED Triage Notes (Signed)
Fever, cough , congestion x 5 days, also unable to eat

## 2021-03-15 NOTE — Discharge Instructions (Signed)
The test indicate that you have a pneumonia.  We have started some treatment.  We sent a prescription to your pharmacy to begin taking tomorrow morning.  Make sure you are getting plenty of rest, and drinking a lot of fluids.  Use Tylenol if needed for pain or fever.  Follow-up with your primary care doctor if not better in a few days.

## 2021-03-15 NOTE — ED Notes (Signed)
Pt taken to the bathroom via wheelchair. Minimum assist. Pt able to stand and ambulate a couple of steps

## 2021-03-15 NOTE — ED Notes (Signed)
Patients O2 saturation 87% on RA with good pleth.  Placed the patient on 2 lpm via [place], provider notified.

## 2021-03-15 NOTE — ED Provider Notes (Signed)
[facility] EMERGENCY DEPARTMENT Provider Note   CSN: [id number] Arrival date & time: 03/15/21  1508     History Chief Complaint  Patient presents with   Shortness of Breath    Teresa Silva is a 65 y.o. female.  HPI She has been ill for several days with cough, which is nonproductive.  She has had decreased appetite and feels nauseated but is not vomiting.  She is not a smoker and does not have chronic respiratory problems.  No known sick exposures.  No other recent illnesses.  There are no other known active modifying factors.    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   Abdominal pain, epigastric 02/13/2014   Degeneration of intervertebral disc of cervical region 02/07/2014   Muscle ache 02/07/2014   Back ache 02/07/2013   Lumbar radiculopathy 02/07/2013    Past Surgical History:  Procedure Laterality Date   BREAST BIOPSY Right 09/12/2015   stereo   BREAST BIOPSY Right 06/26/2019   [place] bx, coil marker, path pending   BREAST EXCISIONAL BIOPSY Right 10/13/2015   lumpectomy DCIS   BREAST LUMPECTOMY Right 09/2015   with Radiation   BREAST LUMPECTOMY WITH NEEDLE LOCALIZATION AND AXILLARY LYMPH NODE DISSECTION Right 10/13/2015   Procedure: BREAST LUMPECTOMY WITH NEEDLE LOCALIZATION AND AXILLARY LYMPH NODE DISSECTION;  Surgeon: Jules Husbands, MD;  Location: [facility];  Service: General;  Laterality: Right;   COLONOSCOPY  2009   no  polyps   COLONOSCOPY WITH PROPOFOL N/A 04/19/2018   Procedure: COLONOSCOPY WITH PROPOFOL;  Surgeon: Virgel Manifold, MD;  Location: [facility];  Service: Endoscopy;  Laterality: N/A;   KNEE ARTHROSCOPY Left 01/07/2017   Procedure: ARTHROSCOPY KNEE;  Surgeon: Leanor Kail, MD;  Location: [place];  Service: Orthopedics;  Laterality: Left;   SENTINEL NODE BIOPSY Right 10/13/2015   Procedure: SENTINEL NODE BIOPSY;  Surgeon: Jules Husbands, MD;  Location: [facility];  Service: General;  Laterality: Right;     OB History   No obstetric history on file.     Family History  Problem Relation Age of Onset   Hypertension Mother    Lung cancer Mother    Arthritis Mother    Hypertension Father    COPD Father    Liver cancer Sister    Breast cancer Cousin     Social History   Tobacco Use   Smoking status: Never   Smokeless tobacco: Never  Vaping Use   Vaping Use: Never used  Substance Use Topics   Alcohol use: Yes    Comment: RARE   Drug use: No    Home Medications Prior to Admission medications   Medication Sig Start Date End Date Taking? Authorizing Provider  amoxicillin-clavulanate (AUGMENTIN) 875-125 MG tablet Take 1 tablet by mouth 2 (two) times daily. One po bid x 7 days 03/15/21  Yes  Daleen Bo, MD  acetaminophen (TYLENOL) 325 MG tablet Take 650 mg by mouth every 6 (six) hours as needed for mild pain.     [provider]  escitalopram (LEXAPRO) 10 MG tablet 10 mg.  09/28/16   [provider]  gabapentin (NEURONTIN) 300 MG capsule Take by mouth. 06/29/17   [provider]  Multiple Vitamin (MULTIVITAMIN) tablet Take 1 tablet by mouth daily.    [provider]  sulindac (CLINORIL) 200 MG tablet  04/24/18   [provider]    Allergies    Tramadol hcl  Review of Systems   Review of Systems  All other systems reviewed and are negative.  Physical Exam Updated Vital Signs BP 113/64    Pulse 93    Temp 99.3 F  (37.4 C) (Oral)    Resp 19    Ht 5\' 3"  (1.6 m)    Wt 46.7 kg    LMP 04/13/2007    SpO2 97%    BMI 18.25 kg/m   Physical Exam Vitals and nursing note reviewed.  Constitutional:      General: She is not in acute distress.    Appearance: She is well-developed. She is not ill-appearing, toxic-appearing or diaphoretic.  HENT:     Head: Normocephalic and atraumatic.     Right Ear: External ear normal.     Left Ear: External ear normal.     Nose: No congestion or rhinorrhea.  Eyes:     Conjunctiva/sclera: Conjunctivae normal.     Pupils: Pupils are equal, round, and reactive to light.  Neck:     Trachea: Phonation normal.  Cardiovascular:     Rate and Rhythm: Normal rate and regular rhythm.     Heart sounds: Normal heart sounds.  Pulmonary:     Effort: Pulmonary effort is normal. No respiratory distress.     Breath sounds: No stridor. Rhonchi present. No wheezing.  Abdominal:     General: There is no distension.     Palpations: Abdomen is soft.     Tenderness: There is no abdominal tenderness.  Musculoskeletal:        General: Normal range of motion.     Cervical back: Normal range of motion and neck supple.     Right lower leg: No edema.     Left lower leg: No edema.  Skin:    General: Skin is warm and dry.  Neurological:     Mental Status: She is alert and oriented to person, place, and time.     Cranial Nerves: No cranial nerve deficit.     Sensory: No sensory deficit.     Motor: No abnormal muscle tone.     Coordination: Coordination normal.  Psychiatric:        Mood and Affect: Mood normal.        Behavior: Behavior normal.        Thought Content: Thought content normal.        Judgment: Judgment normal.    ED Results / Procedures / Treatments   Labs (all labs ordered are listed, but only abnormal results are displayed) Labs Reviewed  BASIC METABOLIC PANEL - Abnormal; Notable for the following components:      Result Value   Sodium 134 (*)    Potassium 3.3 (*)     Glucose, Bld 120 (*)    Calcium 8.4 (*)    All other components within normal limits  CBC WITH DIFFERENTIAL/PLATELET - Abnormal; Notable for the following components:  Hemoglobin 11.8 (*)    HCT 35.8 (*)    Lymphs Abs 0.6 (*)    All other components within normal limits  RESP PANEL BY RT-PCR (FLU A&B, COVID) ARPGX2  MAGNESIUM    EKG EKG Interpretation  Date/Time:  Sunday March 15 2021 15:32:47 EST Ventricular Rate:  114 PR Interval:  129 QRS Duration: 146 QT Interval:  350 QTC Calculation: 482 R Axis:   6 Text Interpretation: Sinus tachycardia Right bundle branch block No old tracing to compare Confirmed by Daleen Bo [phone]) on 03/15/2021 4:09:03 PM  Radiology DG Chest Port 1 View  Result Date: 03/15/2021 CLINICAL DATA:  Shortness of breath. History of right breast cancer. Fever and cough. EXAM: PORTABLE CHEST 1 VIEW COMPARISON:  April 27, 2013 FINDINGS: New infiltrate in the right upper lobe adjacent to the fissure. Mild right basilar opacity. The cardiomediastinal silhouette is normal. No pneumothorax. No nodules or masses. IMPRESSION: New infiltrate in the right upper lobe is consistent with pneumonia given history. Mild opacity in the right base could represent further infiltrate or atelectasis. Recommend short-term follow-up imaging to ensure resolution. Electronically Signed   By: Dorise Bullion III M.D.   On: 03/15/2021 15:56    Procedures .Critical Care Performed by: Daleen Bo, MD Authorized by: Daleen Bo, MD   Critical care provider statement:    Critical care time (minutes):  35   Critical care start time:  03/15/2021 4:10 PM   Critical care end time:  03/15/2021 10:16 PM   Critical care time was exclusive of:  Separately billable procedures and treating other patients   Critical care was necessary to treat or prevent imminent or life-threatening deterioration of the following conditions:  Respiratory failure   Critical care was time spent  personally by me on the following activities:  Blood draw for specimens, development of treatment plan with patient or surrogate, discussions with consultants, evaluation of patient's response to treatment, examination of patient, ordering and performing treatments and interventions, ordering and review of laboratory studies, ordering and review of radiographic studies, pulse oximetry, re-evaluation of patient's condition and review of old charts   Medications Ordered in ED Medications  azithromycin (ZITHROMAX) 500 mg in sodium chloride 0.9 % 250 mL IVPB (0 mg Intravenous Stopped 03/15/21 2009)  sodium chloride 0.9 % bolus 500 mL (0 mLs Intravenous Stopped 03/15/21 1708)  potassium chloride 10 mEq in 100 mL IVPB (0 mEq Intravenous Stopped 03/15/21 2013)  sodium chloride 0.9 % bolus 1,000 mL (0 mLs Intravenous Stopped 03/15/21 2014)  cefTRIAXone (ROCEPHIN) 1 g in sodium chloride 0.9 % 100 mL IVPB (0 g Intravenous Stopped 03/15/21 1907)  fentaNYL (SUBLIMAZE) injection 50 mcg (50 mcg Intravenous Given 03/15/21 1843)    ED Course  I have reviewed the triage vital signs and the nursing notes.  Pertinent labs & imaging results that were available during my care of the patient were reviewed by me and considered in my medical decision making (see chart for details).    MDM Rules/Calculators/A&P                          Patient Vitals for the past 24 hrs:  BP Temp Temp src Pulse Resp SpO2 Height Weight  03/15/21 2200 113/64 -- -- 93 19 97 % -- --  03/15/21 2130 111/63 -- -- 94 (!) 22 96 % -- --  03/15/21 2100 117/66 -- -- 94 (!) 21 97 % -- --  03/15/21 2030 117/70 -- --  96 (!) 23 97 % -- --  03/15/21 2000 113/64 -- -- (!) 103 (!) 24 97 % -- --  03/15/21 1930 121/73 -- -- (!) 102 (!) 22 97 % -- --  03/15/21 1900 129/80 -- -- (!) 102 20 96 % -- --  03/15/21 1848 127/63 -- -- (!) 107 (!) 25 94 % -- --  03/15/21 1800 133/77 -- -- (!) 109 (!) 23 95 % -- --  03/15/21 1720 -- 99.3 F (37.4 C) Oral  -- -- -- -- --  03/15/21 1700 128/86 -- -- (!) 114 (!) 21 95 % -- --  03/15/21 1630 114/65 -- -- (!) 113 19 95 % -- --  03/15/21 1600 133/81 -- -- (!) 113 (!) 21 94 % -- --  03/15/21 1554 (!) 144/77 -- -- (!) 111 (!) 22 93 % -- --  03/15/21 1517 137/86 98.2 F (36.8 C) Oral 100 20 (!) 87 % 5\' 3"  (1.6 m) 46.7 kg    10:10 PM Reevaluation with update and discussion. After initial assessment and treatment, an updated evaluation reveals she is feeling better at this time has no further complaints.  Findings discussed with patient and her husband and all questions were answered. Daleen Bo   Medical Decision Making:  This patient is presenting for evaluation of cough and malaise, which does require a range of treatment options, and is a complaint that involves a high risk of morbidity and mortality. The differential diagnoses include pneumonia, viral infection, acute illness. I decided to review old records, and in summary elderly female, presenting with respiratory infection symptoms, short duration, not recurrent or ongoing.  I obtained additional historical information from husband at bedside.  Clinical Laboratory Tests Ordered, included CBC, Metabolic panel, and viral panel, magnesium level . Review indicates normal except sodium low, potassium low, glucose high, calcium low. Radiologic Tests Ordered, included chest x-ray.  I independently Visualized: Radiograph images, which show right upper lobe infiltrate and possible right lower lobe infiltrate  Cardiac Monitor Tracing which shows sinus tachycardia    Critical Interventions-clinical evaluation, laboratory testing, IV fluids, medication, radiography, observation and reassessment  After These Interventions, the Patient was reevaluated and was found stable for discharge.  Patient with pneumonia, uncomplicated, community-acquired.  No indication for sepsis, metabolic disorder or requirement for hospitalization  CRITICAL  CARE-yes Performed by: Daleen Bo  Nursing Notes Reviewed/ Care Coordinated Applicable Imaging Reviewed Interpretation of Laboratory Data incorporated into ED treatment  The patient appears reasonably screened and/or stabilized for discharge and I doubt any other medical condition or other [facility] requiring further screening, evaluation, or treatment in the ED at this time prior to discharge.  Plan: Home Medications-continue usual; Home Treatments-gradually [employer] and activity; return here if the recommended treatment, does not improve the symptoms; Recommended follow up-PCP, as needed        Final Clinical Impression(s) / ED Diagnoses Final diagnoses:  None    Rx / DC Orders ED Discharge Orders          Ordered    amoxicillin-clavulanate (AUGMENTIN) 875-125 MG tablet  2 times daily        03/15/21 2214             Daleen Bo, MD 03/15/21 2216

## 2021-03-26 ENCOUNTER — Other Ambulatory Visit: Payer: Self-pay | Admitting: Family Medicine

## 2021-04-03 ENCOUNTER — Other Ambulatory Visit: Payer: Self-pay | Admitting: Family Medicine

## 2021-04-03 DIAGNOSIS — Z853 Personal history of malignant neoplasm of breast: Secondary | ICD-10-CM

## 2021-04-08 ENCOUNTER — Other Ambulatory Visit: Payer: Self-pay | Admitting: Family Medicine

## 2021-04-08 DIAGNOSIS — Z853 Personal history of malignant neoplasm of breast: Secondary | ICD-10-CM

## 2021-04-08 DIAGNOSIS — Z1231 Encounter for screening mammogram for malignant neoplasm of breast: Secondary | ICD-10-CM

## 2021-04-08 DIAGNOSIS — N63 Unspecified lump in unspecified breast: Secondary | ICD-10-CM

## 2021-04-14 ENCOUNTER — Encounter (HOSPITAL_COMMUNITY): Payer: Self-pay | Admitting: Radiology

## 2021-04-27 ENCOUNTER — Ambulatory Visit
Admission: RE | Admit: 2021-04-27 | Discharge: 2021-04-27 | Disposition: A | Discharge status: Home / Self Care | Payer: Medicare HMO | Source: Ambulatory Visit | Attending: Family Medicine | Admitting: Family Medicine

## 2021-04-27 ENCOUNTER — Other Ambulatory Visit: Payer: Self-pay

## 2021-04-27 DIAGNOSIS — N63 Unspecified lump in unspecified breast: Secondary | ICD-10-CM

## 2021-04-27 DIAGNOSIS — Z1231 Encounter for screening mammogram for malignant neoplasm of breast: Secondary | ICD-10-CM | POA: Insufficient documentation

## 2021-04-27 DIAGNOSIS — Z853 Personal history of malignant neoplasm of breast: Secondary | ICD-10-CM | POA: Diagnosis present

## 2021-07-25 ENCOUNTER — Encounter (HOSPITAL_COMMUNITY): Payer: Self-pay

## 2021-07-25 ENCOUNTER — Emergency Department (HOSPITAL_COMMUNITY): Payer: Medicare HMO

## 2021-07-25 ENCOUNTER — Emergency Department (HOSPITAL_COMMUNITY)
Admission: EM | Admit: 2021-07-25 | Discharge: 2021-07-25 | Disposition: A | Discharge status: Home / Self Care | Payer: Medicare HMO | Attending: Student | Admitting: Student

## 2021-07-25 ENCOUNTER — Other Ambulatory Visit: Payer: Self-pay

## 2021-07-25 DIAGNOSIS — Z853 Personal history of malignant neoplasm of breast: Secondary | ICD-10-CM | POA: Insufficient documentation

## 2021-07-25 DIAGNOSIS — R109 Unspecified abdominal pain: Secondary | ICD-10-CM

## 2021-07-25 DIAGNOSIS — M419 Scoliosis, unspecified: Secondary | ICD-10-CM | POA: Insufficient documentation

## 2021-07-25 DIAGNOSIS — K5730 Diverticulosis of large intestine without perforation or abscess without bleeding: Secondary | ICD-10-CM | POA: Insufficient documentation

## 2021-07-25 LAB — CBC WITH DIFFERENTIAL/PLATELET
Abs Immature Granulocytes: 0.03 10*3/uL (ref 0.00–0.07)
Basophils Absolute: 0.1 10*3/uL (ref 0.0–0.1)
Basophils Relative: 1 %
Eosinophils Absolute: 0.2 10*3/uL (ref 0.0–0.5)
Eosinophils Relative: 3 %
HCT: 37.1 % (ref 36.0–46.0)
Hemoglobin: 12.3 g/dL (ref 12.0–15.0)
Immature Granulocytes: 0 %
Lymphocytes Relative: 24 %
Lymphs Abs: 1.6 10*3/uL (ref 0.7–4.0)
MCH: 30.1 pg (ref 26.0–34.0)
MCHC: 33.2 g/dL (ref 30.0–36.0)
MCV: 90.9 fL (ref 80.0–100.0)
Monocytes Absolute: 0.7 10*3/uL (ref 0.1–1.0)
Monocytes Relative: 10 %
Neutro Abs: 4.2 10*3/uL (ref 1.7–7.7)
Neutrophils Relative %: 62 %
Platelets: 208 10*3/uL (ref 150–400)
RBC: 4.08 MIL/uL (ref 3.87–5.11)
RDW: 13.1 % (ref 11.5–15.5)
WBC: 6.7 10*3/uL (ref 4.0–10.5)
nRBC: 0 % (ref 0.0–0.2)

## 2021-07-25 LAB — COMPREHENSIVE METABOLIC PANEL
ALT: 12 U/L (ref 0–44)
AST: 18 U/L (ref 15–41)
Albumin: 3.8 g/dL (ref 3.5–5.0)
Alkaline Phosphatase: 78 U/L (ref 38–126)
Anion gap: 5 (ref 5–15)
BUN: 20 mg/dL (ref 8–23)
CO2: 26 mmol/L (ref 22–32)
Calcium: 8.7 mg/dL — ABNORMAL LOW (ref 8.9–10.3)
Chloride: 105 mmol/L (ref 98–111)
Creatinine, Ser: 0.87 mg/dL (ref 0.44–1.00)
GFR, Estimated: >60 mL/min (ref >60)
Glucose, Bld: 116 mg/dL — ABNORMAL HIGH (ref 70–99)
Potassium: 3.5 mmol/L (ref 3.5–5.1)
Sodium: 136 mmol/L (ref 135–145)
Total Bilirubin: 0.4 mg/dL (ref 0.3–1.2)
Total Protein: 7.4 g/dL (ref 6.5–8.1)

## 2021-07-25 LAB — URINALYSIS, ROUTINE W REFLEX MICROSCOPIC
Bacteria, UA: NONE SEEN
Bilirubin Urine: NEGATIVE
Glucose, UA: NEGATIVE mg/dL
Ketones, ur: NEGATIVE mg/dL
Nitrite: NEGATIVE
Protein, ur: NEGATIVE mg/dL
RBC / HPF: >50 RBC/hpf — ABNORMAL HIGH (ref 0–5)
Specific Gravity, Urine: 1.008 (ref 1.005–1.030)
pH: 6 (ref 5.0–8.0)

## 2021-07-25 NOTE — ED Triage Notes (Signed)
Pt arrived via POV c/o severe left side flank pain causing some nausea. Pt tried using a heating pad at home with minimal relief, and pain temporarily was relieved, but came back suddenly this evening. Denies painful micturition. ?

## 2021-07-25 NOTE — ED Notes (Signed)
Patient transported to CT 

## 2021-07-25 NOTE — ED Provider Notes (Signed)
?[facility] ?Provider Note ? ?CSN: [id number] ?Arrival date & time: 07/25/21 2100 ? ?Chief Complaint(s) ?Flank Pain ? ?HPI ?Teresa Silva is a 66 y.o. female who presents emergency department for evaluation of flank pain.  Patient states that today she had acute onset left flank pain that came in waves.  She denied associated nausea, vomiting, headache, fever, chest pain, shortness of breath or any additional symptoms.  She states her pain peaked prior to arrival but currently her flank pain has resolved. ? ? ?Past Medical History ?Past Medical History:  ?Diagnosis Date  ? Arthritis   ? BACK-LUMBAR-KNEES  ? Breast cancer ([place]) 10/14/2015  ? right DCIS  ? Complication of anesthesia   ? GERD (gastroesophageal reflux disease)   ? OCC  ? Personal history of radiation therapy   ? f/u rt breast cancer  ? PONV (postoperative nausea and vomiting)   ? Vertigo   ? IN PAST  ? ?Patient Active Problem List  ? Diagnosis Date Noted  ? Acquired scoliosis 07/25/2021  ? Numbness 01/09/2021  ? Diverticulosis of large intestine without diverticulitis 01/29/2019  ? Special screening for malignant neoplasms, colon   ? Polyp of sigmoid colon   ? Pain in both hands 10/11/2017  ? Primary osteoarthritis of left knee 02/22/2017  ? 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 Abdominal pain, epigastric 02/13/2014  ? Degeneration of intervertebral disc of cervical region 02/07/2014  ? Myalgia 02/07/2014  ? Chronic low back pain 02/07/2013  ? Lumbar radiculopathy 02/07/2013  ? History of hepatic disease 07/29/2011  ? ?Home Medication(s) ?Prior to Admission medications   ?Medication Sig Start Date End Date Taking? Authorizing Provider  ?escitalopram (LEXAPRO) 20 MG tablet  09/28/16  Yes [provider]  ?gabapentin (NEURONTIN) 300 MG capsule Take by mouth. 06/29/17  Yes [provider]  ?HYDROcodone-acetaminophen (NORCO/VICODIN) 5-325 MG tablet Take by mouth. 07/02/19  Yes [provider]  ?ondansetron (ZOFRAN-ODT) 4 MG disintegrating tablet  08/03/19  Yes [provider]  ?predniSONE (DELTASONE) 10 MG tablet 5,5,4,4,3,3,2,2,1,1 tab po tapering dose 11/27/20  Yes [provider]  ?acetaminophen (TYLENOL) 325 MG tablet Take 650 mg by mouth every 6 (six) hours as needed for mild pain.     [provider]  ?acetaminophen (TYLENOL) 325 MG tablet Take by mouth.    [provider]  ?amoxicillin-clavulanate (AUGMENTIN) 875-125 MG tablet Take 1 tablet by mouth 2 (two) times daily. One po bid x 7 days 03/15/21   Daleen Bo, MD  ?escitalopram (LEXAPRO) 10 MG tablet 10 mg.  09/28/16   [provider]  ?escitalopram (LEXAPRO) 20 MG tablet Take 20 mg by mouth daily. 06/19/21   [provider]  ?gabapentin (NEURONTIN) 300 MG capsule Take by mouth. 06/29/17   [provider]  ?HYDROcodone bit-homatropine (HYCODAN) 5-1.5 MG/5ML syrup SMARTSIG:1 Teaspoon By Mouth Every 4-6 Hours PRN 03/18/21   [provider]  ?Multiple Vitamin (MULTIVITAMIN) tablet Take 1 tablet by mouth daily.    [provider]  ?ondansetron (ZOFRAN-ODT) 4 MG disintegrating tablet Take by mouth. 07/20/21   [provider]  ?ranitidine (ZANTAC) 150 MG tablet Take by mouth.    [provider]  ?sulindac (CLINORIL) 200 MG tablet  04/24/18   [provider]  ?                                                                                                                                  ?  Past Surgical History ?Past Surgical History:  ?Procedure Laterality Date  ? BREAST BIOPSY Right 09/12/2015  ? stereo  ? BREAST BIOPSY Right 06/26/2019  ? [place] bx, coil marker, path pending  ? BREAST EXCISIONAL BIOPSY Right 10/13/2015  ? lumpectomy DCIS  ? BREAST LUMPECTOMY Right 09/2015  ? with Radiation  ? BREAST LUMPECTOMY WITH NEEDLE LOCALIZATION AND AXILLARY LYMPH NODE DISSECTION Right 10/13/2015  ? Procedure: BREAST LUMPECTOMY WITH NEEDLE LOCALIZATION AND AXILLARY  LYMPH NODE DISSECTION;  Surgeon: Jules Husbands, MD;  Location: [facility];  Service: General;  Laterality: Right;  ? COLONOSCOPY  2009  ? no polyps  ? COLONOSCOPY WITH PROPOFOL N/A 04/19/2018  ? Procedure: COLONOSCOPY WITH PROPOFOL;  Surgeon: Virgel Manifold, MD;  Location: [facility];  Service: Endoscopy;  Laterality: N/A;  ? KNEE ARTHROSCOPY Left 01/07/2017  ? Procedure: ARTHROSCOPY KNEE;  Surgeon: Leanor Kail, MD;  Location: [place];  Service: Orthopedics;  Laterality: Left;  ? SENTINEL NODE BIOPSY Right 10/13/2015  ? Procedure: SENTINEL NODE BIOPSY;  Surgeon: Jules Husbands, MD;  Location: [facility];  Service: General;  Laterality: Right;  ? ?Family History ?Family History  ?Problem Relation Age of Onset  ? Hypertension Mother   ? Lung cancer Mother   ? Arthritis Mother   ? Hypertension Father   ? COPD Father   ? Liver cancer Sister   ? Breast cancer Cousin   ? ? ?Social History ?Social History  ? ?Tobacco Use  ? Smoking status: Never  ? Smokeless tobacco: Never  ?Vaping Use  ? Vaping Use: Never used  ?Substance Use Topics  ? Alcohol use: Yes  ?  Comment: RARE  ? Drug use: No  ? ?Allergies ?Tramadol hcl and Tramadol ? ?Review of Systems ?Review of Systems  ?Genitourinary:  Positive for flank pain.  ? ?Physical Exam ?Vital Signs  ?I have reviewed the triage vital signs ?BP (!) 141/87   Pulse 82   Temp 98.5 ?F (36.9 ?C)   Resp 18   Ht '5\' 3"'$  (1.6 m)   Wt 46.7 kg   LMP 04/13/2007   SpO2 96%   BMI 18.24 kg/m?  ? ?Physical Exam ?Vitals and nursing note reviewed.  ?Constitutional:   ?   General: She is not in acute distress. ?   Appearance: She is well-developed.  ?HENT:  ?   Head: Normocephalic and atraumatic.  ?Eyes:  ?   Conjunctiva/sclera: Conjunctivae normal.  ?Cardiovascular:  ?   Rate and Rhythm: Normal rate and regular rhythm.  ?   Heart sounds: No murmur heard. ?Pulmonary:  ?   Effort: Pulmonary effort is normal. No respiratory distress.  ?   Breath sounds: Normal breath sounds.   ?Abdominal:  ?   Palpations: Abdomen is soft.  ?   Tenderness: There is no abdominal tenderness.  ?Musculoskeletal:     ?   General: No swelling.  ?   Cervical back: Neck supple.  ?Skin: ?   General: Skin is warm and dry.  ?   Capillary Refill: Capillary refill takes less than 2 seconds.  ?Neurological:  ?   Mental Status: She is alert.  ?Psychiatric:     ?   Mood and Affect: Mood normal.  ? ? ?ED Results and Treatments ?Labs ?(all labs ordered are listed, but only abnormal results are displayed) ?Labs Reviewed  ?URINALYSIS, ROUTINE W REFLEX MICROSCOPIC - Abnormal; Notable for the following components:  ?    Result Value  ? Hgb urine dipstick LARGE (*)   ?  Leukocytes,Ua SMALL (*)   ? RBC / HPF >50 (*)   ? All other components within normal limits  ?COMPREHENSIVE METABOLIC PANEL - Abnormal; Notable for the following components:  ? Glucose, Bld 116 (*)   ? Calcium 8.7 (*)   ? All other components within normal limits  ?CBC WITH DIFFERENTIAL/PLATELET  ?                                                                                                                       ? ?Radiology ?CT Renal Stone Study ? ?Result Date: 07/25/2021 ?CLINICAL DATA:  Left-sided flank pain EXAM: CT ABDOMEN AND PELVIS WITHOUT CONTRAST TECHNIQUE: Multidetector CT imaging of the abdomen and pelvis was performed following the standard protocol without IV contrast. RADIATION DOSE REDUCTION: This exam was performed according to the departmental dose-optimization program which includes automated exposure control, adjustment of the mA and/or kV according to patient size and/or use of iterative reconstruction technique. COMPARISON:  06/10/2014 FINDINGS: Lower chest: Mild scarring is noted in the bases bilaterally. Hepatobiliary: No focal liver abnormality is seen. No gallstones, gallbladder wall thickening, or biliary dilatation. Pancreas: Unremarkable. No pancreatic ductal dilatation or surrounding inflammatory changes. Spleen: Normal in size  without focal abnormality. Adrenals/Urinary Tract: Adrenal glands are within normal limits. Kidneys are well visualized bilaterally. No renal calculi or obstructive changes are noted. The bladder is well distended. S

## 2021-12-02 ENCOUNTER — Encounter: Payer: Self-pay | Admitting: Gastroenterology

## 2021-12-02 ENCOUNTER — Other Ambulatory Visit: Payer: Self-pay

## 2021-12-02 ENCOUNTER — Ambulatory Visit: Payer: Medicare HMO | Admitting: Gastroenterology

## 2021-12-02 VITALS — BP 171/91 | HR 83 | Temp 97.9°F | Ht 63.0 in | Wt 136.1 lb

## 2021-12-02 DIAGNOSIS — R1319 Other dysphagia: Secondary | ICD-10-CM

## 2021-12-02 DIAGNOSIS — R110 Nausea: Secondary | ICD-10-CM

## 2021-12-02 DIAGNOSIS — R12 Heartburn: Secondary | ICD-10-CM | POA: Diagnosis not present

## 2021-12-02 MED ORDER — OMEPRAZOLE 40 MG PO CPDR
40.0000 mg | DELAYED_RELEASE_CAPSULE | Freq: Every day | ORAL | 0 refills | Status: DC
Start: 2021-12-02 — End: 2021-12-04

## 2021-12-02 NOTE — Progress Notes (Signed)
Cephas Darby, MD [address]  [place]  [place], [place] [address]  Main: [phone]  Fax: [phone]    Gastroenterology Consultation  Referring Provider:     Ranae Plumber, [place] Primary Care Physician:  Ranae Plumber, [place] Primary Gastroenterologist:  Dr. Cephas Darby Reason for Consultation: Chronic nausea, difficulty swallowing        HPI:   Teresa Silva is a 66 y.o. female referred by Ranae Plumber, [place]  for consultation & management of chronic nausea and difficulty swallowing.  Patient reports approximately 6 months history of nausea, started intermittently, now constant, wakes up with nausea in the last throughout the day.  Denies any vomiting but reports regurgitation.  She also reports food getting stuck in her lower chest which has become more frequent lately.  She denies any nocturnal symptoms.  She does report intermittent heartburn.  She denies any weight loss, epigastric pain.  She is not on any acid suppressive medication.  Patient denies any headache, dizziness  She does not smoke or drink alcohol  NSAIDs: None  Antiplts/Anticoagulants/Anti thrombotics: None  GI Procedures:   Colonoscopy 04/19/2018 - One 5 mm polyp in the sigmoid colon, removed with a cold biopsy forceps. Resected and retrieved. Clips were placed. - Diverticulosis in the sigmoid colon. - The examination was otherwise normal. - The rectum, sigmoid colon, descending colon, transverse colon, ascending colon and cecum are normal. - The distal rectum and anal verge are normal on retroflexion view. DIAGNOSIS:  A.  COLON POLYP, SIGMOID; COLD BIOPSY:  - POLYPOID FRAGMENTS OF INFLAMED GRANULATION TISSUE, SEE COMMENT.   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   Complication of anesthesia    GERD (gastroesophageal reflux disease)    OCC   Personal history of radiation therapy    f/u rt breast cancer    PONV (postoperative nausea and vomiting)    Vertigo    IN PAST    Past Surgical History:  Procedure Laterality Date   BREAST BIOPSY Right 09/12/2015   stereo   BREAST BIOPSY Right 06/26/2019   [place] bx, coil marker, path pending   BREAST EXCISIONAL BIOPSY Right 10/13/2015   lumpectomy DCIS   BREAST LUMPECTOMY Right 09/2015   with Radiation   BREAST LUMPECTOMY WITH NEEDLE LOCALIZATION AND AXILLARY LYMPH NODE DISSECTION Right 10/13/2015   Procedure: BREAST LUMPECTOMY WITH NEEDLE LOCALIZATION AND AXILLARY LYMPH NODE DISSECTION;  Surgeon: Jules Husbands, MD;  Location: [facility];  Service: General;  Laterality: Right;   COLONOSCOPY  2009   no polyps   COLONOSCOPY WITH PROPOFOL N/A 04/19/2018   Procedure: COLONOSCOPY WITH PROPOFOL;  Surgeon: Virgel Manifold, MD;  Location: [facility];  Service: Endoscopy;  Laterality: N/A;   KNEE ARTHROSCOPY Left 01/07/2017   Procedure: ARTHROSCOPY KNEE;  Surgeon: Leanor Kail, MD;  Location: [facility];  Service: Orthopedics;  Laterality: Left;   SENTINEL NODE BIOPSY Right 10/13/2015   Procedure: SENTINEL NODE BIOPSY;  Surgeon: Jules Husbands, MD;  Location: [facility];  Service: General;  Laterality: Right;     Current Outpatient Medications:    acetaminophen (TYLENOL) 325 MG tablet, Take by mouth., Disp: , Rfl:    escitalopram (LEXAPRO) 20 MG tablet, Take 20 mg by mouth daily., Disp: , Rfl:    gabapentin (NEURONTIN) 300 MG capsule, Take by mouth., Disp: , Rfl:    HYDROcodone-acetaminophen (NORCO/VICODIN) 5-325 MG tablet, Take  by mouth., Disp: , Rfl:    omeprazole (PRILOSEC) 40 MG capsule, Take 1 capsule (40 mg total) by mouth daily before breakfast., Disp: 30 capsule, Rfl: 0   ondansetron (ZOFRAN-ODT) 4 MG disintegrating tablet, , Disp: , Rfl:    sulindac (CLINORIL) 200 MG tablet, , Disp: , Rfl:    Family History  Problem Relation Age of Onset   Hypertension Mother    Lung cancer Mother    Arthritis Mother    Hypertension Father     COPD Father    Liver cancer Sister    Breast cancer Cousin      Social History   Tobacco Use   Smoking status: Never   Smokeless tobacco: Never  Vaping Use   Vaping Use: Never used  Substance Use Topics   Alcohol use: Yes    Comment: RARE   Drug use: No    Allergies as of 12/02/2021 - Review Complete 12/02/2021  Allergen Reaction Noted   Tramadol hcl Other (See Comments) 09/17/2015   Tramadol  07/25/2021    Review of Systems:    All systems reviewed and negative except where noted in HPI.   Physical Exam:  BP (!) 171/91 (BP Location: Left Arm, Patient Position: Sitting, Cuff Size: Normal)   Pulse 83   Temp 97.9 F (36.6 C) (Oral)   Ht '5\' 3"'$  (1.6 m)   Wt 136 lb 2 oz (61.7 kg)   LMP 04/13/2007   BMI 24.11 kg/m  Patient's last menstrual period was 04/13/2007.  General:   Alert,  Well-developed, well-nourished, pleasant and cooperative in NAD Head:  Normocephalic and atraumatic. Eyes:  Sclera clear, no icterus.   Conjunctiva pink. Ears:  Normal auditory acuity. Nose:  No deformity, discharge, or lesions. Mouth:  No deformity or lesions,oropharynx pink & moist. Neck:  Supple; no masses or thyromegaly. Lungs:  Respirations even and unlabored.  Clear throughout to auscultation.   No wheezes, crackles, or rhonchi. No acute distress. Heart:  Regular rate and rhythm; no murmurs, clicks, rubs, or gallops. Abdomen:  Normal bowel sounds. Soft, non-tender and non-distended without masses, hepatosplenomegaly or hernias noted.  No guarding or rebound tenderness.   Rectal: Not performed Msk:  Symmetrical without gross deformities. Good, equal movement & strength bilaterally. Pulses:  Normal pulses noted. Extremities:  No clubbing or edema.  No cyanosis. Neurologic:  Alert and oriented x3;  grossly normal neurologically. Skin:  Intact without significant lesions or rashes. No jaundice. Psych:  Alert and cooperative. Normal mood and affect.  Imaging  Studies: Reviewed  Assessment and Plan:   Teresa Silva is a 66 y.o. female with history of chronic GERD, chronic nausea without vomiting, dysphagia to solids  Recommend EGD for further evaluation Recommend to start Prilosec 40 mg daily before breakfast  I have discussed alternative options, risks & benefits,  which include, but are not limited to, bleeding, infection, perforation,respiratory complication & drug reaction.  The patient agrees with this plan & written consent will be obtained.    Follow up based on the above work-up   Cephas Darby, MD

## 2021-12-04 ENCOUNTER — Ambulatory Visit: Payer: Medicare HMO | Admitting: Anesthesiology

## 2021-12-04 ENCOUNTER — Encounter
Admission: RE | Disposition: A | Discharge status: Home / Self Care | Payer: Self-pay | Source: Home / Self Care | Attending: Gastroenterology

## 2021-12-04 ENCOUNTER — Ambulatory Visit
Admission: RE | Admit: 2021-12-04 | Discharge: 2021-12-04 | Disposition: A | Discharge status: Home / Self Care | Payer: Medicare HMO | Attending: Gastroenterology | Admitting: Gastroenterology

## 2021-12-04 ENCOUNTER — Encounter: Payer: Self-pay | Admitting: Gastroenterology

## 2021-12-04 DIAGNOSIS — R110 Nausea: Secondary | ICD-10-CM | POA: Diagnosis not present

## 2021-12-04 DIAGNOSIS — K2289 Other specified disease of esophagus: Secondary | ICD-10-CM | POA: Diagnosis not present

## 2021-12-04 DIAGNOSIS — Z923 Personal history of irradiation: Secondary | ICD-10-CM | POA: Diagnosis not present

## 2021-12-04 DIAGNOSIS — R1314 Dysphagia, pharyngoesophageal phase: Secondary | ICD-10-CM | POA: Insufficient documentation

## 2021-12-04 DIAGNOSIS — K2100 Gastro-esophageal reflux disease with esophagitis, without bleeding: Secondary | ICD-10-CM | POA: Diagnosis not present

## 2021-12-04 DIAGNOSIS — R1319 Other dysphagia: Secondary | ICD-10-CM | POA: Diagnosis not present

## 2021-12-04 DIAGNOSIS — K2210 Ulcer of esophagus without bleeding: Secondary | ICD-10-CM | POA: Diagnosis not present

## 2021-12-04 DIAGNOSIS — Z86000 Personal history of in-situ neoplasm of breast: Secondary | ICD-10-CM | POA: Diagnosis not present

## 2021-12-04 DIAGNOSIS — K449 Diaphragmatic hernia without obstruction or gangrene: Secondary | ICD-10-CM | POA: Insufficient documentation

## 2021-12-04 HISTORY — PX: ESOPHAGOGASTRODUODENOSCOPY (EGD) WITH PROPOFOL: SHX5813

## 2021-12-04 SURGERY — ESOPHAGOGASTRODUODENOSCOPY (EGD) WITH PROPOFOL
Anesthesia: General

## 2021-12-04 MED ORDER — PROPOFOL 500 MG/50ML IV EMUL
INTRAVENOUS | Status: DC | PRN
  Administered 2021-12-04: 140 ug/kg/min via INTRAVENOUS

## 2021-12-04 MED ORDER — LIDOCAINE HCL (CARDIAC) PF 100 MG/5ML IV SOSY
PREFILLED_SYRINGE | INTRAVENOUS | Status: DC | PRN
  Administered 2021-12-04: 80 mg via INTRAVENOUS

## 2021-12-04 MED ORDER — PROPOFOL 10 MG/ML IV BOLUS
INTRAVENOUS | Status: DC | PRN
  Administered 2021-12-04: 80 mg via INTRAVENOUS

## 2021-12-04 MED ORDER — SODIUM CHLORIDE 0.9 % IV SOLN: INTRAVENOUS | Status: DC

## 2021-12-04 MED ORDER — OMEPRAZOLE 40 MG PO CPDR: 40.0000 mg | DELAYED_RELEASE_CAPSULE | Freq: Every day | ORAL | 2 refills | Status: DC

## 2021-12-04 NOTE — Transfer of Care (Signed)
Immediate Anesthesia Transfer of Care Note  Patient: Teresa Silva  Procedure(s) Performed: ESOPHAGOGASTRODUODENOSCOPY (EGD) WITH PROPOFOL  Patient Location: PACU and Endoscopy Unit  Anesthesia Type:General  Level of Consciousness: drowsy  Airway & Oxygen Therapy: Patient Spontanous Breathing  Post-op Assessment: Report given to RN  Post vital signs: stable  Last Vitals:  Vitals Value Taken Time  BP    Temp    Pulse    Resp    SpO2      Last Pain:  Vitals:   12/04/21 0945  TempSrc: Temporal  PainSc: 0-No pain         Complications: No notable events documented.

## 2021-12-04 NOTE — Anesthesia Preprocedure Evaluation (Signed)
Anesthesia Evaluation  Patient identified by MRN, date of birth, ID band Patient awake    Reviewed: Allergy & Precautions, NPO status , Patient's Chart, lab work & pertinent test results  History of Anesthesia Complications (+) PONV and history of anesthetic complications  Airway Mallampati: III  TM Distance: <3 FB Neck ROM: full    Dental  (+) Chipped   Pulmonary neg pulmonary ROS, neg shortness of breath,    Pulmonary exam normal        Cardiovascular (-) anginanegative cardio ROS Normal cardiovascular exam     Neuro/Psych  Neuromuscular disease negative psych ROS   GI/Hepatic Neg liver ROS, GERD  Controlled,  Endo/Other  negative endocrine ROS  Renal/GU negative Renal ROS  negative genitourinary   Musculoskeletal   Abdominal   Peds  Hematology negative hematology ROS (+)   Anesthesia Other Findings Past Medical History: No date: Arthritis     Comment:  BACK-LUMBAR-KNEES 10/14/2015: Breast cancer ([place])     Comment:  right DCIS No date: Complication of anesthesia No date: GERD (gastroesophageal reflux disease)     Comment:  OCC No date: Personal history of radiation therapy     Comment:  f/u rt breast cancer No date: PONV (postoperative nausea and vomiting) No date: Vertigo     Comment:  IN PAST  Past Surgical History: 09/12/2015: BREAST BIOPSY; Right     Comment:  stereo 06/26/2019: BREAST BIOPSY; Right     Comment:  [place] bx, coil marker, path pending 10/13/2015: BREAST EXCISIONAL BIOPSY; Right     Comment:  lumpectomy DCIS 09/2015: BREAST LUMPECTOMY; Right     Comment:  with Radiation 10/13/2015: BREAST LUMPECTOMY WITH NEEDLE LOCALIZATION AND AXILLARY  LYMPH NODE DISSECTION; Right     Comment:  Procedure: BREAST LUMPECTOMY WITH NEEDLE LOCALIZATION               AND AXILLARY LYMPH NODE DISSECTION;  Surgeon: Jules Husbands, MD;  Location: [facility];  Service: General;                 Laterality: Right; 2009: COLONOSCOPY     Comment:  no polyps 04/19/2018: COLONOSCOPY WITH PROPOFOL; N/A     Comment:  Procedure: COLONOSCOPY WITH PROPOFOL;  Surgeon:               Virgel Manifold, MD;  Location: [facility];                Service: Endoscopy;  Laterality: N/A; 01/07/2017: KNEE ARTHROSCOPY; Left     Comment:  Procedure: ARTHROSCOPY KNEE;  Surgeon: Leanor Kail,              MD;  Location: [facility];  Service:               Orthopedics;  Laterality: Left; 10/13/2015: SENTINEL NODE BIOPSY; Right     Comment:  Procedure: SENTINEL NODE BIOPSY;  Surgeon: Jules Husbands, MD;  Location: [facility];  Service: General;                Laterality: Right;  BMI    Body Mass Index: 23.91 kg/m      Reproductive/Obstetrics negative OB ROS  Anesthesia Physical Anesthesia Plan  ASA: 3  Anesthesia Plan: General   Post-op Pain Management:    Induction: Intravenous  PONV Risk Score and Plan: Propofol infusion and TIVA  Airway Management Planned: Natural Airway and Nasal Cannula  Additional Equipment:   Intra-op Plan:   Post-operative Plan:   Informed Consent: I have reviewed the patients History and Physical, chart, labs and discussed the procedure including the risks, benefits and alternatives for the proposed anesthesia with the patient or authorized representative who has indicated his/her understanding and acceptance.     Dental Advisory Given  Plan Discussed with: Anesthesiologist, CRNA and Surgeon  Anesthesia Plan Comments: (Patient consented for risks of anesthesia including but not limited to:  - adverse reactions to medications - risk of airway placement if required - damage to eyes, teeth, lips or other oral mucosa - nerve damage due to positioning  - sore throat or hoarseness - Damage to heart, brain, nerves, lungs, other parts of body or loss of life  Patient voiced  understanding.)        Anesthesia Quick Evaluation

## 2021-12-04 NOTE — Anesthesia Postprocedure Evaluation (Signed)
Anesthesia Post Note  Patient: Teresa Silva  Procedure(s) Performed: ESOPHAGOGASTRODUODENOSCOPY (EGD) WITH PROPOFOL  Patient location during evaluation: Endoscopy Anesthesia Type: General Level of consciousness: awake and alert Pain management: pain level controlled Vital Signs Assessment: post-procedure vital signs reviewed and stable Respiratory status: spontaneous breathing, nonlabored ventilation, respiratory function stable and patient connected to nasal cannula oxygen Cardiovascular status: blood pressure returned to baseline and stable Postop Assessment: no apparent nausea or vomiting Anesthetic complications: no   No notable events documented.   Last Vitals:  Vitals:   12/04/21 1110 12/04/21 1111  BP: (!) 148/88   Pulse: 65 66  Resp: 14   Temp:    SpO2: 99% 97%    Last Pain:  Vitals:   12/04/21 0945  TempSrc: Temporal  PainSc: 0-No pain                 Precious Haws Kursten Kruk

## 2021-12-04 NOTE — Op Note (Signed)
[facility] Gastroenterology Patient Name: Teresa Silva Procedure Date: 12/04/2021 10:37 AM MRN: [id number] Account #: [id number] Date of Birth: 01/25/1956 Admit Type: Outpatient Age: 66 Room: [facility] END[facility] ROOM 2 Gender: Female Note Status: Finalized Instrument Name: Upper Endoscope [id number] Procedure:             Upper GI endoscopy Indications:           Esophageal dysphagia, Nausea Providers:             Lin Landsman MD, MD Medicines:             General Anesthesia Complications:         No immediate complications. Estimated blood loss: None. Procedure:             Pre-Anesthesia Assessment:                        - Prior to the procedure, a History and Physical was                         performed, and patient medications and allergies were                         reviewed. The patient is competent. The risks and                         benefits of the procedure and the sedation options and                         risks were discussed with the patient. All questions                         were answered and informed consent was obtained.                         Patient identification and proposed procedure were                         verified by the physician, the nurse, the                         anesthesiologist, the anesthetist and the technician                         in the pre-procedure area in the procedure room in the                         endoscopy suite. Mental Status Examination: alert and                         oriented. Airway Examination: normal oropharyngeal                         airway and neck mobility. Respiratory Examination:                         clear to auscultation. CV Examination: normal.                         Prophylactic Antibiotics:  The patient does not require                         prophylactic antibiotics. Prior Anticoagulants: The                         patient has taken no previous anticoagulant or                          antiplatelet agents. ASA Grade Assessment: III - A                         patient with severe systemic disease. After reviewing                         the risks and benefits, the patient was deemed in                         satisfactory condition to undergo the procedure. The                         anesthesia plan was to use general anesthesia.                         Immediately prior to administration of medications,                         the patient was re-assessed for adequacy to receive                         sedatives. The heart rate, respiratory rate, oxygen                         saturations, blood pressure, adequacy of pulmonary                         ventilation, and response to care were monitored                         throughout the procedure. The physical status of the                         patient was re-assessed after the procedure.                        After obtaining informed consent, the endoscope was                         passed under direct vision. Throughout the procedure,                         the patient's blood pressure, pulse, and oxygen                         saturations were monitored continuously. The Endoscope                         was introduced through the mouth, and advanced to the  second part of duodenum. The upper GI endoscopy was                         accomplished without difficulty. The patient tolerated                         the procedure well. Findings:      The duodenal bulb and second portion of the duodenum were normal.      The entire examined stomach was normal. Biopsies were taken with a cold       forceps for Helicobacter pylori testing.      The cardia and gastric fundus were normal on retroflexion.      A small hiatal hernia was present.      Esophagogastric landmarks were identified: the gastroesophageal junction       was found at 35 cm from the incisors.      LA Grade B (one or  more mucosal breaks greater than 5 mm, not extending       between the tops of two mucosal folds) esophagitis with no bleeding was       found at the gastroesophageal junction.      A single area of ectopic gastric mucosa was found in the upper third of       the esophagus. Impression:            - Normal duodenal bulb and second portion of the                         duodenum.                        - Normal stomach. Biopsied.                        - Small hiatal hernia.                        - Esophagogastric landmarks identified.                        - LA Grade B reflux esophagitis with no bleeding.                        - Ectopic gastric mucosa in the upper third of the                         esophagus. Recommendation:        - Discharge patient to home (with escort).                        - Resume previous diet today.                        - Continue present medications.                        - Await pathology results.                        - Follow an antireflux regimen.                        -  Use Prilosec (omeprazole) 40 mg PO daily for 3                         months. Procedure Code(s):     --- Professional ---                        [id number], Esophagogastroduodenoscopy, flexible,                         transoral; with biopsy, single or multiple Diagnosis Code(s):     --- Professional ---                        K44.9, Diaphragmatic hernia without obstruction or                         gangrene                        K21.00, Gastro-esophageal reflux disease with                         esophagitis, without bleeding                        R13.14, Dysphagia, pharyngoesophageal phase                        R11.0, Nausea CPT copyright 2019 American Medical Association. All rights reserved. The codes documented in this report are preliminary and upon coder review may  be revised to meet current compliance requirements. Dr. Ulyess Mort Lin Landsman MD,  MD 12/04/2021 10:49:15 AM This report has been signed electronically. Number of Addenda: 0 Note Initiated On: 12/04/2021 10:37 AM Estimated Blood Loss:  Estimated blood loss: none.      [facility]

## 2021-12-04 NOTE — H&P (Signed)
Cephas Darby, MD [address]  [place]  [place], [place] [address]  Main: [phone]  Fax: [phone] Pager: [phone]  Primary Care Physician:  Ranae Plumber, [place] Primary Gastroenterologist:  Dr. Cephas Darby  Pre-Procedure History & Physical: HPI:  Teresa Silva is a 66 y.o. female is here for an endoscopy.   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   Complication of anesthesia    GERD (gastroesophageal reflux disease)    OCC   Personal history of radiation therapy    f/u rt breast cancer   PONV (postoperative nausea and vomiting)    Vertigo    IN PAST    Past Surgical History:  Procedure Laterality Date   BREAST BIOPSY Right 09/12/2015   stereo   BREAST BIOPSY Right 06/26/2019   [place] bx, coil marker, path pending   BREAST EXCISIONAL BIOPSY Right 10/13/2015   lumpectomy DCIS   BREAST LUMPECTOMY Right 09/2015   with Radiation   BREAST LUMPECTOMY WITH NEEDLE LOCALIZATION AND AXILLARY LYMPH NODE DISSECTION Right 10/13/2015   Procedure: BREAST LUMPECTOMY WITH NEEDLE LOCALIZATION AND AXILLARY LYMPH NODE DISSECTION;  Surgeon: Jules Husbands, MD;  Location: [facility];  Service: General;  Laterality: Right;   COLONOSCOPY  2009   no polyps   COLONOSCOPY WITH PROPOFOL N/A 04/19/2018   Procedure: COLONOSCOPY WITH PROPOFOL;  Surgeon: Virgel Manifold, MD;  Location: [facility];  Service: Endoscopy;  Laterality: N/A;   KNEE ARTHROSCOPY Left 01/07/2017   Procedure: ARTHROSCOPY KNEE;  Surgeon: Leanor Kail, MD;  Location: [facility];  Service: Orthopedics;  Laterality: Left;   SENTINEL NODE BIOPSY Right 10/13/2015   Procedure: SENTINEL NODE BIOPSY;  Surgeon: Jules Husbands, MD;  Location: [facility];  Service: General;  Laterality: Right;    Prior to Admission medications   Medication Sig Start Date End Date Taking? Authorizing Provider  escitalopram (LEXAPRO) 20 MG  tablet Take 20 mg by mouth daily. 06/19/21  Yes [provider]  gabapentin (NEURONTIN) 300 MG capsule Take by mouth. 06/29/17  Yes [provider]  acetaminophen (TYLENOL) 325 MG tablet Take by mouth.    [provider]  HYDROcodone-acetaminophen (NORCO/VICODIN) 5-325 MG tablet Take by mouth. 07/02/19   [provider]  omeprazole (PRILOSEC) 40 MG capsule Take 1 capsule (40 mg total) by mouth daily before breakfast. 12/02/21   Lin Landsman, MD  ondansetron (ZOFRAN-ODT) 4 MG disintegrating tablet  08/03/19   [provider]  sulindac (CLINORIL) 200 MG tablet  04/24/18   [provider]    Allergies as of 12/02/2021 - Review Complete 12/02/2021  Allergen Reaction Noted   Tramadol hcl Other (See Comments) 09/17/2015   Tramadol  07/25/2021    Family History  Problem Relation Age of Onset   Hypertension Mother    Lung cancer Mother    Arthritis Mother    Hypertension Father    COPD Father    Liver cancer Sister    Breast cancer Cousin     Social History   Socioeconomic History   Marital status: Married    Spouse name: Not on file   Number of children: Not on file   Years of education: Not on file   Highest education level: Not on file  Occupational History   Not on file  Tobacco Use   Smoking status: Never   Smokeless tobacco: Never  Vaping Use  Vaping Use: Never used  Substance and Sexual Activity   Alcohol use: Yes    Comment: RARE   Drug use: No   Sexual activity: Not Currently  Other Topics Concern   Not on file  Social History Narrative   Not on file   Social Determinants of Health   Financial Resource Strain: Not on file  Food Insecurity: Not on file  Transportation Needs: Not on file  Physical Activity: Not on file  Stress: Not on file  Social Connections: Not on file  Intimate Partner Violence: Not on file    Review of Systems: See HPI, otherwise negative ROS  Physical Exam: BP (!) 152/92    Pulse 75   Temp (!) 96.9 F (36.1 C) (Temporal)   Resp 16   Ht '5\' 3"'$  (1.6 m)   Wt 61.2 kg   LMP 04/13/2007   SpO2 98%   BMI 23.91 kg/m  General:   Alert,  pleasant and cooperative in NAD Head:  Normocephalic and atraumatic. Neck:  Supple; no masses or thyromegaly. Lungs:  Clear throughout to auscultation.    Heart:  Regular rate and rhythm. Abdomen:  Soft, nontender and nondistended. Normal bowel sounds, without guarding, and without rebound.   Neurologic:  Alert and  oriented x4;  grossly normal neurologically.  Impression/Plan: Teresa Silva is here for an endoscopy to be performed for chronic nausea, dysphagia  Risks, benefits, limitations, and alternatives regarding  endoscopy have been reviewed with the patient.  Questions have been answered.  All parties agreeable.   Sherri Sear, MD  12/04/2021, 10:27 AM

## 2021-12-07 LAB — SURGICAL PATHOLOGY

## 2021-12-09 ENCOUNTER — Encounter: Payer: Self-pay | Admitting: Gastroenterology

## 2022-01-04 ENCOUNTER — Telehealth: Payer: Self-pay

## 2022-01-04 MED ORDER — OMEPRAZOLE 40 MG PO CPDR: 40.0000 mg | DELAYED_RELEASE_CAPSULE | Freq: Every day | ORAL | 2 refills | Status: AC

## 2022-01-04 NOTE — Telephone Encounter (Signed)
Last office visit 12/02/2021  Last refill 12/04/2021 2 refills  Patient does not have any refills on it she states

## 2022-06-07 ENCOUNTER — Other Ambulatory Visit: Payer: Self-pay | Admitting: Family Medicine

## 2022-06-07 DIAGNOSIS — Z1231 Encounter for screening mammogram for malignant neoplasm of breast: Secondary | ICD-10-CM

## 2022-07-07 ENCOUNTER — Ambulatory Visit
Admission: RE | Admit: 2022-07-07 | Discharge: 2022-07-07 | Disposition: A | Discharge status: Home / Self Care | Payer: Medicare HMO | Source: Ambulatory Visit | Attending: Family Medicine | Admitting: Family Medicine

## 2022-07-07 DIAGNOSIS — Z1231 Encounter for screening mammogram for malignant neoplasm of breast: Secondary | ICD-10-CM | POA: Diagnosis present

## 2023-09-08 ENCOUNTER — Ambulatory Visit
Admission: RE | Admit: 2023-09-08 | Discharge: 2023-09-08 | Disposition: A | Discharge status: Home / Self Care | Source: Ambulatory Visit | Attending: Family Medicine | Admitting: Family Medicine

## 2023-09-08 ENCOUNTER — Other Ambulatory Visit: Payer: Self-pay | Admitting: Family Medicine

## 2023-09-08 DIAGNOSIS — R159 Full incontinence of feces: Secondary | ICD-10-CM | POA: Diagnosis present
